# Patient Record
Sex: Female | Born: 1945
Health system: Southern US, Community
[De-identification: ages and names within clinical notes are randomized; demographics above are authoritative.]

## PROBLEM LIST (undated history)

## (undated) DIAGNOSIS — M8580 Other specified disorders of bone density and structure, unspecified site: Secondary | ICD-10-CM

## (undated) DIAGNOSIS — F419 Anxiety disorder, unspecified: Secondary | ICD-10-CM

## (undated) DIAGNOSIS — K219 Gastro-esophageal reflux disease without esophagitis: Secondary | ICD-10-CM

## (undated) DIAGNOSIS — N151 Renal and perinephric abscess: Secondary | ICD-10-CM

## (undated) DIAGNOSIS — F32A Depression, unspecified: Secondary | ICD-10-CM

## (undated) DIAGNOSIS — K635 Polyp of colon: Secondary | ICD-10-CM

## (undated) DIAGNOSIS — F329 Major depressive disorder, single episode, unspecified: Secondary | ICD-10-CM

## (undated) DIAGNOSIS — G4700 Insomnia, unspecified: Secondary | ICD-10-CM

## (undated) DIAGNOSIS — K222 Esophageal obstruction: Secondary | ICD-10-CM

## (undated) DIAGNOSIS — E039 Hypothyroidism, unspecified: Secondary | ICD-10-CM

## (undated) HISTORY — DX: Other specified disorders of bone density and structure, unspecified site: M85.80

## (undated) HISTORY — DX: Major depressive disorder, single episode, unspecified: F32.9

## (undated) HISTORY — DX: Anxiety disorder, unspecified: F41.9

## (undated) HISTORY — DX: Esophageal obstruction: K22.2

## (undated) HISTORY — DX: Insomnia, unspecified: G47.00

## (undated) HISTORY — DX: Polyp of colon: K63.5

## (undated) HISTORY — PX: ESOPHAGEAL DILATION: SHX303

## (undated) HISTORY — PX: TOTAL KNEE ARTHROPLASTY: SHX125

## (undated) HISTORY — DX: Hypothyroidism, unspecified: E03.9

## (undated) HISTORY — DX: Renal and perinephric abscess: N15.1

## (undated) HISTORY — DX: Depression, unspecified: F32.A

---

## 1999-06-16 ENCOUNTER — Other Ambulatory Visit: Admission: RE | Admit: 1999-06-16 | Discharge: 1999-06-16 | Payer: Self-pay | Admitting: Obstetrics and Gynecology

## 1999-12-25 HISTORY — PX: BLADDER SUSPENSION: SHX72

## 1999-12-25 HISTORY — PX: ANTERIOR AND POSTERIOR REPAIR: SHX1172

## 2000-03-20 ENCOUNTER — Encounter: Admission: RE | Admit: 2000-03-20 | Discharge: 2000-03-20 | Payer: Self-pay | Admitting: Urology

## 2000-03-20 ENCOUNTER — Encounter: Payer: Self-pay | Admitting: Urology

## 2000-06-14 ENCOUNTER — Encounter: Payer: Self-pay | Admitting: Urology

## 2000-06-14 ENCOUNTER — Encounter: Admission: RE | Admit: 2000-06-14 | Discharge: 2000-06-14 | Payer: Self-pay | Admitting: Urology

## 2000-07-03 ENCOUNTER — Other Ambulatory Visit: Admission: RE | Admit: 2000-07-03 | Discharge: 2000-07-03 | Payer: Self-pay | Admitting: Gynecology

## 2000-09-17 ENCOUNTER — Inpatient Hospital Stay (HOSPITAL_COMMUNITY): Admission: RE | Admit: 2000-09-17 | Discharge: 2000-09-19 | Payer: Self-pay | Admitting: Urology

## 2001-10-29 ENCOUNTER — Other Ambulatory Visit: Admission: RE | Admit: 2001-10-29 | Discharge: 2001-10-29 | Payer: Self-pay | Admitting: Gynecology

## 2001-12-24 DIAGNOSIS — K635 Polyp of colon: Secondary | ICD-10-CM

## 2001-12-24 HISTORY — DX: Polyp of colon: K63.5

## 2002-01-28 ENCOUNTER — Encounter (INDEPENDENT_AMBULATORY_CARE_PROVIDER_SITE_OTHER): Payer: Self-pay | Admitting: *Deleted

## 2002-01-28 ENCOUNTER — Ambulatory Visit (HOSPITAL_COMMUNITY): Admission: RE | Admit: 2002-01-28 | Discharge: 2002-01-28 | Payer: Self-pay | Admitting: Gastroenterology

## 2003-01-15 ENCOUNTER — Other Ambulatory Visit: Admission: RE | Admit: 2003-01-15 | Discharge: 2003-01-15 | Payer: Self-pay | Admitting: Gynecology

## 2004-04-10 ENCOUNTER — Other Ambulatory Visit: Admission: RE | Admit: 2004-04-10 | Discharge: 2004-04-10 | Payer: Self-pay | Admitting: Gynecology

## 2005-02-08 ENCOUNTER — Ambulatory Visit: Payer: Self-pay | Admitting: Family Medicine

## 2005-04-23 ENCOUNTER — Other Ambulatory Visit: Admission: RE | Admit: 2005-04-23 | Discharge: 2005-04-23 | Payer: Self-pay | Admitting: Gynecology

## 2005-05-22 ENCOUNTER — Ambulatory Visit: Payer: Self-pay | Admitting: Family Medicine

## 2005-09-24 ENCOUNTER — Ambulatory Visit: Payer: Self-pay | Admitting: Family Medicine

## 2006-10-23 ENCOUNTER — Other Ambulatory Visit: Admission: RE | Admit: 2006-10-23 | Discharge: 2006-10-23 | Payer: Self-pay | Admitting: Gynecology

## 2007-01-22 ENCOUNTER — Inpatient Hospital Stay (HOSPITAL_COMMUNITY): Admission: RE | Admit: 2007-01-22 | Discharge: 2007-01-26 | Payer: Self-pay | Admitting: Orthopedic Surgery

## 2007-11-24 ENCOUNTER — Other Ambulatory Visit: Admission: RE | Admit: 2007-11-24 | Discharge: 2007-11-24 | Payer: Self-pay | Admitting: Gynecology

## 2007-11-26 ENCOUNTER — Encounter: Payer: Self-pay | Admitting: Gynecology

## 2007-11-26 ENCOUNTER — Ambulatory Visit (HOSPITAL_BASED_OUTPATIENT_CLINIC_OR_DEPARTMENT_OTHER): Admission: RE | Admit: 2007-11-26 | Discharge: 2007-11-26 | Payer: Self-pay | Admitting: Gynecology

## 2007-11-26 HISTORY — PX: OOPHORECTOMY: SHX86

## 2008-12-29 ENCOUNTER — Other Ambulatory Visit: Admission: RE | Admit: 2008-12-29 | Discharge: 2008-12-29 | Payer: Self-pay | Admitting: Gynecology

## 2008-12-29 ENCOUNTER — Ambulatory Visit: Payer: Self-pay | Admitting: Gynecology

## 2008-12-29 ENCOUNTER — Encounter: Payer: Self-pay | Admitting: Gynecology

## 2009-01-18 ENCOUNTER — Ambulatory Visit: Payer: Self-pay | Admitting: Gynecology

## 2009-01-19 ENCOUNTER — Ambulatory Visit: Payer: Self-pay | Admitting: Gynecology

## 2009-12-30 ENCOUNTER — Ambulatory Visit: Payer: Self-pay | Admitting: Gynecology

## 2009-12-30 ENCOUNTER — Other Ambulatory Visit: Admission: RE | Admit: 2009-12-30 | Discharge: 2009-12-30 | Payer: Self-pay | Admitting: Gynecology

## 2010-03-01 ENCOUNTER — Ambulatory Visit: Payer: Self-pay | Admitting: Gynecology

## 2010-09-08 ENCOUNTER — Ambulatory Visit: Payer: Self-pay | Admitting: Gynecology

## 2011-01-01 ENCOUNTER — Ambulatory Visit
Admission: RE | Admit: 2011-01-01 | Discharge: 2011-01-01 | Payer: Self-pay | Source: Home / Self Care | Attending: Gynecology | Admitting: Gynecology

## 2011-01-01 ENCOUNTER — Other Ambulatory Visit
Admission: RE | Admit: 2011-01-01 | Discharge: 2011-01-01 | Payer: Self-pay | Source: Home / Self Care | Admitting: Gynecology

## 2011-01-11 ENCOUNTER — Ambulatory Visit: Admit: 2011-01-11 | Payer: Self-pay | Admitting: Gynecology

## 2011-01-24 ENCOUNTER — Encounter: Payer: Self-pay | Admitting: Gynecology

## 2011-01-24 ENCOUNTER — Other Ambulatory Visit: Payer: BC Managed Care – PPO

## 2011-01-24 DIAGNOSIS — R823 Hemoglobinuria: Secondary | ICD-10-CM

## 2011-01-24 DIAGNOSIS — Z13220 Encounter for screening for lipoid disorders: Secondary | ICD-10-CM

## 2011-01-24 DIAGNOSIS — Z01419 Encounter for gynecological examination (general) (routine) without abnormal findings: Secondary | ICD-10-CM

## 2011-01-24 DIAGNOSIS — E079 Disorder of thyroid, unspecified: Secondary | ICD-10-CM

## 2011-02-13 ENCOUNTER — Encounter (INDEPENDENT_AMBULATORY_CARE_PROVIDER_SITE_OTHER): Payer: BC Managed Care – PPO

## 2011-02-13 DIAGNOSIS — M949 Disorder of cartilage, unspecified: Secondary | ICD-10-CM

## 2011-05-08 NOTE — Op Note (Signed)
[name], [name]                  ACCOUNT NO.:  [id number]   MEDICAL RECORD NO.:  [id number]          PATIENT TYPE:  AMB   LOCATION:  [facility]                         FACILITY:  [facility]   PHYSICIAN:  Juan H. Lily Peer, M.D.DATE OF BIRTH:  10-30-1946   DATE OF PROCEDURE:  11/26/2007  DATE OF DISCHARGE:                               OPERATIVE REPORT   FIRST ASSISTANT:  Timothy P. Fontaine, M.D.   INDICATIONS FOR OPERATION:  A 65 year old gravida 5, para 3, and AB 2  with history of chronic pelvic pain and history of ovarian cyst.   PREOPERATIVE DIAGNOSES:  1. Chronic left lower quadrant pain.  2. History of ovarian cyst.  3. Family history of ovarian cancer.   POSTOPERATIVE DIAGNOSES:  1. Chronic left lower quadrant pain.  2. History of ovarian cyst.  3. Family history of ovarian cancer.   ANESTHESIA:  General endotracheal anesthesia.   PROCEDURE PERFORMED:  Laparoscopic bilateral salpingo-oophorectomy.   FINDINGS:  Retroverted uterus, normal-appearing fallopian tubes and  ovaries.   DESCRIPTION OF OPERATION:  After the patient was adequately counseled,  she was taken to the operating room where she underwent a successful  general endotracheal anesthesia.  The patient had PSA stockings for DVT  prophylaxis and received 63 g of cefoxitin for prophylaxis as well.  A  Foley catheter was placed to monitor urinary output and the abdomen,  vagina, and perineum had been prepped in the usual sterile fashion.  A  small stab incision was made underneath the umbilicus.  With the use of  the EXcel\OptiVu entrance into the abdominal cavity was performed  under direct visualization.  Due to extensive subcutaneous fat, an open  laparoscopy technique needed to be utilized to gain entrance into the  peritoneal cavity.  A circumferential suture with 0 Vicryl suture around  the fascia to form a seal around the trocar sheath was utilized.  Once  the pneumoperitoneum was established, approximately 3.5  L of carbon  dioxide had been insufflated.  Two additional 5-mm ports were introduced  in the lower abdomen under laparoscopic guidance.  Attention was placed  to the anterior and posterior cul-de-sac.  No adhesions or endometriosis  were noted.  Both tube and ovaries appeared to be normal.  The right  infundibulopelvic ligament was identified.  The ovary was placed under  tension.  The right ureter was identified and with the utilization of  the Harmonic scalpel, the infundibulopelvic ligament was coaptated and  transected as was the remainder of the mesosalpinx up to the area of the  utero-ovarian ligament, which was also transected.  The right tube and  ovary was removed and placed on the anterior cul-de-sac and then a  similar procedure was carried out on the contralateral side.  Through  one of the 5-mm ports, the diagnostic scope was inserted, and the  laparoscopic Endopouch was inserted through the 11-mm port, and both  ovaries were placed into the laparoscopic basket retriever, and was  removed and passed off the operative field for histological evaluation.  The pelvic cavity was then inspected.  There was good  hemostasis.  The  pneumoperitoneum was removed.  The subumbilical incision of the fascia  was closed with running stitches of 0 Vicryl suture, and the  subcutaneous fat was reapproximated with 3-0 Vicryl suture.  The  subcutaneous tissue was closed with 4-0 plain catgut suture.  The two 5-  mm ports were closed with Dermabond and 1 additional stitch on the left  lower port, a single stitch of 4-0 plain catgut suture.  For  postoperative analgesia, 0.25% Marcaine was infiltrated in all 3  incision sites for a total of 10 mL.  Sponge count and needle count were  correct.  The patient was extubated and transferred to recovery room  with stable vital signs.  Blood loss was minimal, and IV fluids  consisted 1700 mL of lactated Ringers, and urine output 300 mL and   clear.      Juan H. Lily Peer, M.D.  Electronically Signed     JHF/MEDQ  D:  11/26/2007  T:  11/27/2007  Job:  [id number]

## 2011-05-08 NOTE — H&P (Signed)
[name], [name]                  ACCOUNT NO.:  [id number]   MEDICAL RECORD NO.:  [id number]          PATIENT TYPE:  AMB   LOCATION:  [facility]                         FACILITY:  [facility]   PHYSICIAN:  Juan H. Lily Peer, M.D.DATE OF BIRTH:  1946-08-31   DATE OF ADMISSION:  DATE OF DISCHARGE:                              HISTORY & PHYSICAL   The patient is scheduled for surgery on Wednesday November 26, 2007, at  8:30 a.m. at [facility].   CHIEF COMPLAINT:  Chronic pelvic pain and history of ovarian cyst.   HISTORY:  The patient is a 65 year old gravida 5, para 3, AB 2 who had  been complaining of chronic pelvic pain, especially on the left lower  quadrant and has had in the past history of ovarian cyst.  Last time she  had been seen in the office was prior to November 24, 2007, visit whereby  an ultrasound in June 05, 2007, had demonstrated that she had an echo-  free thin-walled cyst on the right adnexa, which measured 17 x 12 x 13  mm.  The left ovary was normal.  The patient does have a strong family  history of ovarian cancer whereby 3 aunts and her grandmother have had  history of ovarian and breast cancer as well, and the patient is  cancerphobic and wanted to proceed with a laparoscopy to assess for  pelvic pain and proceed with bilateral salpingo-oophorectomy since she  is now 65 years of age.  She is postmenopausal.  She is on no hormonal  replacement therapy, although she does have hypothyroidism for which she  was on Synthroid but had not returned to the office for a followup TSH  after her TSH had demonstrated in November of last year that it was  subtherapeutic at 0.00, and her Synthroid had been decreased at that  time to 125 mcg daily.  She had a followup TSH done in the office on  November 24, 2007, which was yesterday; result pending at the time of  this dictation.   PAST MEDICAL HISTORY:  The patient denies any allergy.  She had some  form of left renal  abscess many years ago.  Has history of  hypothyroidism; colonic polyps, benign, removed in 2003; suffers from  anxiety and depression for which she takes Paxil.   PAST SURGICAL HISTORY:  Consist of cystocele and rectocele with sling  procedure in 2001 and right total knee replacement.   MEDICATIONS:  Consist of Synthroid 125 mcg daily, Paxil 25 mg daily, and  Caltrate one p.o. b.i.d.   PHYSICAL EXAMINATION:  VITAL SIGNS:  The patient's blood pressure is  121/76.  HEENT:  Unremarkable.  NECK:  Supple.  Trachea midline.  No carotid bruits or thyromegaly.  LUNGS:  Clear to auscultation without rhonchi or wheezes.  HEART:  Regular rate and rhythm.  No murmurs or gallop.  BREASTS:  Unremarkable.  ABDOMEN:  Soft and nontender.  No rebound or guarding.  PELVIC:  Bartholin, urethra, and Skene's within normal limits.  Vagina  and cervix, no lesion or discharge.  Uterus  anteverted; normal size,  shape, and consistency.  Adnexa without any palpable mass or tenderness.  RECTAL:  Hemoccult negative.   ASSESSMENT:  A 65 year old patient with chronic pelvic pain, left  greater than right; prior history of ovarian cyst and a strong family  history of ovarian cancer.  The patient is scheduled to proceed with a  laparoscopic bilateral salpingo-oophorectomy on Wednesday, November 26, 2007, at 8:30 a.m. at [facility].  The risks, benefits,  and pros and cons of the operation were discussed to include infection  although she will receive prophylactic antibiotic; the risk of deep  venous thrombosis and subsequent pulmonary embolism were also discussed  and for prophylaxis, she will be wearing PSA stockings.  We also  discussed the risk of trauma to internal organ requiring emergency  laparotomy and correcting injury.  Also, we discussed in the event of  hemorrhage if she were to need a blood transfusion, risk from her  transfusion include anaphylactic reaction, hepatitis, and AIDS  were  discussed as well.  All these issues were discussed with the patient in  detail.  All questions were answered, and we will follow accordingly.   PLAN:  The patient is scheduled for a laparoscopic bilateral salpingo-  oophorectomy on Wednesday, November 26, 2007, at 8:30 a.m. in [facility].      Juan H. Lily Peer, M.D.  Electronically Signed     JHF/MEDQ  D:  11/25/2007  T:  11/26/2007  Job:  [id number]   cc:   [facility]

## 2011-05-11 NOTE — Procedures (Signed)
. [facility]  Patient:    [name], [name] Visit Number: [id number] MRN: [id number]          Service Type: END Location: ENDO Attending Physician:  Charna Elizabeth Dictated by:   Anselmo Rod, M.D. Proc. Date: 01/28/02 Admit Date:  01/28/2002   CC:         Juan H. Lily Peer, M.D.   Procedure Report  DATE OF BIRTH:  07-28-1946.  PROCEDURE:  Colonoscopy with snare polypectomy x1.  ENDOSCOPIST:  Anselmo Rod, M.D.  INSTRUMENT USED:  Olympus video colonoscope.  INDICATION FOR PROCEDURE:  A 65 year old white female with a history of colon cancer in the family and rectal bleeding.  Rule out colonic polyps, masses, hemorrhoids, etc.  PREPROCEDURE PREPARATION:  Informed consent was procured from the patient. The patient was fasted for eight hours prior to the procedure and prepped with a bottle of magnesium citrate and a gallon of NuLytely the night prior to the procedure.  PREPROCEDURE PHYSICAL:  VITAL SIGNS:  The patient had stable vital signs.  NECK:  Supple.  CHEST:  Clear to auscultation.  S1, S2 regular.  ABDOMEN:  Soft with normal bowel sounds.  DESCRIPTION OF PROCEDURE:  The patient was placed in the left lateral decubitus position and sedated with 50 mg of Demerol and 5 mg of Versed intravenously.  Once the patient was adequately sedate and maintained on low-flow oxygen and continuous cardiac monitoring, the Olympus video colonoscope was advanced from the rectum to the cecum without difficulty.  The patient had a fairly good prep.  Small, nonbleeding internal hemorrhoids were appreciated on retroflexion in the rectum.  The patient had some early left-sided diverticulosis.  A small sessile polyp was snared at 40 cm.  The cecum, right colon, transverse colon appeared normal and without lesions.  IMPRESSION: 1. Small, nonbleeding internal hemorrhoids. 2. Early left-sided diverticulosis. 3. Small sessile polyp snared at  40 cm. 4. Normal-appearing transverse colon, right colon, and cecum.  IMPRESSION: 1. Await pathology results. 2. Avoid all nonsteroidals for the next four weeks. 3. Outpatient follow-up in the next four weeks.   RECOMMENDATIONS: Dictated by:   Anselmo Rod, M.D. Attending Physician:  Charna Elizabeth DD:  01/28/02 TD:  01/29/02 Job: [id number]

## 2011-05-11 NOTE — Discharge Summary (Signed)
NAMELAURENA, [name] NO.:  [id number]   MEDICAL RECORD NO.:  [id number]          PATIENT TYPE:  INP   LOCATION:  1612                         FACILITY:  [facility]   PHYSICIAN:  Ollen Gross, M.D.    DATE OF BIRTH:  1946-07-05   DATE OF ADMISSION:  01/22/2007  DATE OF DISCHARGE:  01/26/2007                               DISCHARGE SUMMARY   ADMISSION DIAGNOSES:  1. Osteoarthritis right knee.  2. Hypothyroidism.  3. Lower temporal seizures.  4. History of previous kidney mass, resolved.  5. Ovarian cyst.   DISCHARGE DIAGNOSES:  1. Osteoarthritis right knee, status post right total knee      arthroplasty.  2. Acute blood loss anemia; did not require transfusion.  3. Hypothyroidism.  4. Lower temporal seizures.  5. History of previous kidney mass, resolved.  6. Ovarian cyst.   PROCEDURE:  January 22, 2007, right total knee.  Surgeon:  Ollen Gross, M.D., Assistant:  Avel Peace, PA-C.  Spinal anesthesia.  Tourniquet time 39 minutes.   CONSULTATIONS:  None.   HISTORY OF PRESENT ILLNESS:  Briefly, [name] is a 65 year old female  with end-stage arthritis of her right knee with progressive worsening  and pain, failed all management including injections, who now presents  for total knee.   LABORATORY DATA:  Preoperative CBC with hemoglobin 13.4, hematocrit  40.1, white blood cell count 8.0. Postoperative hemoglobin 9.8, drifted  down to 8.8.  Last known H and H 8.3 and 24.9.  She was asymptomatic  with low hemoglobin.  PT/PTT 13.2 and 29 respectively on admission, INR  1.0.  Serial Pro Time's followed, last noted PT/INR 27.2 and 2.4.  Chemistry panel on admission all within normal limits.  Serial BMET's  were followed.  Electrolytes remained within normal limits.  Calcium did  drop from 9.5 to 8.3.  TSH level taken, low at 0.044.  Preoperative  urinalysis negative.  Blood group and type O negative.   CLINICAL DATA:  Electrocardiogram January 16, 2007  marked sinus  bradycardia with no significant change when compared to January 16, 2007  confirmed by Dr. Lady Deutscher.  Two-view chest January 16, 2007 no  active disease.   HOSPITAL COURSE:  The patient was admitted to [facility],  tolerated the procedure well.  Later the patient was transferred to the  recovery room and then to the orthopedic floor, started on PCA and p.o.  analgesics for pain control.  Following surgery actually had a pretty  decent night; on the night of surgery was seen on rounds, starting to  get up out of bed.  Hemovac drain was pulled on day #1 to reduce the  fluids, discontinued the nasal cannula.  Patient started getting up with  physical therapy.  By day #2 had gotten a little bit more rest, was  feeling better.  Pain was under better control.  The PCA and IV's were  discontinued.  Felt the patient may be ready to go home over the  weekend.  Her hemoglobin was down to 8.8 but she was asymptomatic.  She  was placed on iron supplements.  Started to get up out of bed more with  physical therapy.  She ambulated about 80 feet.  She was doing well by  day #3.  Dressing was changed on day #2 and then followed each day.  Patient was seen on day #3 and day #4 and continued with therapy, was  tolerating this and by January 26, 2007 tolerating medication's,  progressing with therapy, wound healing well and was discharged home.   DISCHARGE PLAN:  The patient is discharged home on January 26, 2007.   DISCHARGE DIAGNOSES:  Please see above.   DISCHARGE MEDICATIONS:  1. Percocet.  2. Robaxin.  3. Nu-Iron.  4. Coumadin.   DIET:  Resume home diet.   ACTIVITY:  Weight-bearing as tolerated to the right lower extremity.  Home health physical therapy, home health nursing.  May start showering.  Daily dressing changes.   FOLLOWUP:  Followup in two weeks.   DISPOSITION:  Home.   CONDITION ON DISCHARGE:  Improved.      Alexzandrew L. Julien Girt,  P.A.      Ollen Gross, M.D.  Electronically Signed    ALP/MEDQ  D:  02/21/2007  T:  02/21/2007  Job:  [id number]

## 2011-05-11 NOTE — Op Note (Signed)
[facility]  Patient:    [name], [name]                      MRN: [id number] Proc. Date: 09/17/00 Adm. Date:  [id number] Attending:  Londell Moh                           Operative Report  PREOPERATIVE DIAGNOSES: 1. Stress urinary incontinence. 2. Cystocele. 3. Rectocele.  POSTOPERATIVE DIAGNOSES: 1. Stress urinary incontinence. 2. Cystocele. 3. Rectocele.  OPERATION:  Cystoscopy, pubovaginal sling, anterior repair of cystocele, posterior repair of rectocele.  Suprapubic tube placement.  SURGEON:  Jamison Neighbor, M.D.  ANESTHESIA:  General.  COMPLICATIONS:  None.  DRAINS:  None.  INDICATIONS:  [age]year-old female was first seen by me because of problems with a renal abscess.  The patient had appropriate antibiotic therapy and postoperative CT showed resolution of the abscess.  The patient has had significant problems with stress urinary incontinence. She has undergone a urodynamic evaluation which shows a normal bladder with the exception of a very low leak point pressure.  On examination, she had a cystocele and rectocele but no uterine abnormalities, and the uterus was noted to be very small and atrophic.  The patient was seen by Dr. Lily Peer who felt that repair of her cystocele and incontinence repair would certainly be worthwhile but did not feel that hysterectomy was warranted since it was small and in normal position with no prolapse.  The patient agreed not to have the hysterectomy and now is to undergo correction of her incontinence as well as a cystocele repair.  She had requested that a rectocele repair be done at this same time because she is having some problems with her bowels.  The patient understands the risks and benefits of the procedure and gave her full informed consent.  She does realize that she will need a suprapubic tube in place and she may have problems with either complete emptying or urinary  urgency postoperatively that could require additional corrective procedures.  DESCRIPTION OF PROCEDURE:  After the successful induction of general anesthesia, the patient was placed in the dorsal lithotomy position, prepped with Betadine and draped in the usual sterile fashion.  The labia was sutured to the medial thigh and the weighted vaginal speculum was placed.  The anterior vaginal mucosa was then opened with a midline incision beginning at the mid urethral compass extending all the way back towards the cardinal ligaments.  Flaps and mucosa were raised bilaterally and dissection proceeded all the way back towards the space of Retzius.  The space of Retzius was entered on both sides.  A transverse incision was made directly over the pubic bone of approximately 1 inch in length and was carried down to the Scarpas fascia until the rectus sheath and the symphysis pubis were identified.  The patient had a large somewhat redundant and prolapsing cystocele.  Horizontal mattress sutures of 2-0 Vicryl were used to reduce the cystocele.  A sling was then made from a 4 x 7 piece of fascia.  This was constructed in a T-fashion so that the main portion of sling was 7 cm long and 2 cm wide with an extension coming down the middle that was approximately 2 cm in width and 4 cm in length.  The end of this sling were oversewn with #1 nylon.  A ________ was passed from the umbilicus down to the vaginal incision  on both sides and used to pull the sling up.  The sling was then tacked in place with 2-0 Vicryl, the top part bridged between the mid urethral complex and the bladder neck and this tension completely covered the cystocele repair used as reinforcements.  This was tacked down in several places with Vicryl for good coverage.  The anterior vaginal mucosa was then trimmed and closed with a running suture of 2-0 Vicryl.  Cystoscopy was performed.  The bladder was carefully inspected and free of any  tumors or stones.  Both ureteral orifices were of normal configuration and location.  Clear urine was seen to come from each.  The 12 degree and 70 degree lens were used to make sure that there was no injury to the bladder and no suture material could be seen in the bladder in any way.  Elevation of the sling did cause nice suspension of the bladder neck.  Under direct vision, the suprapubic tube was passed through the small stab incision and sutured in place with 2-0 nylon sutures.  The sling was then tied in an appropriate degree of tissue.  The cystoscope was left in place to make sure that there was coaptation but no angulation.  There was 30 degrees or more of play in the cystoscope with no excessive angulation.  With a full bladder, Cred maneuver showed prompt flow of urine and two fingerbreadths could be placed underneath the sling after it had been tied down.  This was done to ensure that there was no over correction.  Attention was then directed to the rectocele.  A transverse incision was made just above the peroneal body and a dissection proceeded underneath the mucosa. The flaps of the mucosa were raised.  The levators were grasped bilaterally and pulled together in the midline with a series of mattress sutures.  The mucosa was trimmed and then closed.  A perineorrhaphy was performed to tighten up the peroneal bind but care was taken to ensure that there was no foreshortening or excessive narrowing of the vagina.  At the end of the procedure, the vagina accepted two fingerbreadths easily with no stenosis or restriction and there was plenty of depth to the vagina with no foreshortening.  The area was then packed.  The labial sutures were then taken down.  The suprapubic tube was placed in straight transit, and dressing was applied.  The patient tolerated the procedure well and was taken to the recovery room in good condition.DD:  09/17/00 TD:  09/17/00 Job: [id number]

## 2011-05-11 NOTE — Op Note (Signed)
NAMETERESEA, [name] NO.:  [id number]   MEDICAL RECORD NO.:  [id number]          PATIENT TYPE:  INP   LOCATION:  0004                         FACILITY:  [facility]   PHYSICIAN:  Ollen Gross, M.D.    DATE OF BIRTH:  23-Dec-1946   DATE OF PROCEDURE:  01/22/2007  DATE OF DISCHARGE:                               OPERATIVE REPORT   PREOPERATIVE DIAGNOSIS:  Osteoarthritis, right knee.   POSTOPERATIVE DIAGNOSIS:  Osteoarthritis, right knee.   PROCEDURE:  Right total knee arthroplasty.   SURGEON:  Ollen Gross, M.D.   ASSISTANT:  Avel Peace, PA-C   ANESTHESIA:  Spinal.   ESTIMATED BLOOD LOSS:  Minimal.   DRAINS:  Hemovac times one.   TOURNIQUET TIME:  39 minutes at 300 mmHg.   COMPLICATIONS:  None.   CONDITION:  Stable to recovery.   BRIEF CLINICAL NOTE:  [name] is a 65 year old female with end-stage  osteoarthritis of the right knee with progressively worsening pain and  dysfunction.  She has failed nonoperative management including  injections and presents for total knee arthroplasty.   PROCEDURE IN DETAIL:  After successful administration of spinal  anesthetic, a tourniquet placed on the right thigh and right lower  extremity prepped and draped in usual sterile fashion.  Extremity was  wrapped in Esmarch, knee flexed, tourniquet inflated 300 mmHg.  Midline  incision made with 10 blade through subcutaneous tissue to level of the  extensor mechanism.  The fresh blade is used to make a medial  parapatellar arthrotomy.  Soft tissue over the proximal medial tibia  subperiosteally elevated to the joint line with the knife and into the  semimembranosus bursa with Cobb elevator.  Soft tissue laterally is  elevated with attention being paid to avoid patellar tendon on tibial  tubercle.  Patella subluxed laterally, knee flexed 90 degrees, ACL and  PCL removed.  Drill was used for a starting hole and distal femur canal  was thoroughly irrigated.  5 degrees  right valgus alignment guide is  placed and referencing off the posterior condyles, rotations marked and  the block pinned to remove 11 mm of the distal femur.  We took 11  because of a flexion contracture.  Sizing blocks placed, size 2.5 is  most appropriate.  Rotations marked at the epicondylar axis.  Size 2.5  cutting blocks placed and the anterior-posterior and chamfer cuts made.   Tibia subluxed forward and the menisci removed.  Extramedullary tibial  alignment guide is placed referencing proximally at the medial aspect of  tibial tubercle and distally along the second metatarsal axis and tibial  crest.  Blocks pinned to remove 10 mm of the non deficient lateral side.  Tibial resection is made with an oscillating saw.  Size 2.5 is the most  appropriate tibial component and the proximal tibia was prepared with  the modular drill and keel punch for size 2.5.  Femoral preparation is  completed with the intercondylar cut.   Size 2.5 mobile bearing tibial trial, 2.5 posterior stabilized femoral  trial and a 10 mm posterior stabilized rotating platform insert  trial  are placed.  With the 10, full extensions achieved with excellent varus  and valgus balance throughout full range of motion.  Patella was  everted, thickness measured to be 20 mm.  Freehand resection taken to 12  mm, 35 templates placed, lug holes were drilled, trial patella is placed  and it tracks normally.  Osteophytes removed off the posterior femur  with the trial in place.  All trials removed and the cut bone surface  was prepared with pulsatile lavage.  Cement was mixed and once ready for  implantation, the size 2.5 mobile bearing tibial tray, size 2.5  posterior stabilized femur and 35 patella are cemented into place.  The  patella was held with the clamp.  Trial 10-mm inserts placed, knee held  in full extension and all extruded cement removed.  Once cement fully  hardened, then the permanent 10 mm posterior  stabilized rotating  platform insert is placed into the tibial tray.  Wound was copiously  irrigated with saline solution and the extensor mechanism closed over  Hemovac drain with interrupted #1 PDS.  Flexion against gravity to 135  degrees.  Tourniquet is released with a  total time of 39 minutes.  Subcu was closed interrupted 2-0 Vicryl, subcuticular running 4-0  Monocryl.  Drains hooked to suction.  Incision cleaned and dried and  Steri-Strips and a bulky sterile dressing applied.  She is then placed  into a knee immobilizer, awakened and transferred to recovery in stable  condition.      Ollen Gross, M.D.  Electronically Signed     FA/MEDQ  D:  01/22/2007  T:  01/22/2007  Job:  [id number]

## 2011-05-11 NOTE — H&P (Signed)
NAMESHAMELL, HITTLE NO.:  [id number]   MEDICAL RECORD NO.:  [id number]          PATIENT TYPE:  INP   LOCATION:  NA                           FACILITY:  [facility]   PHYSICIAN:  Ollen Gross, M.D.    DATE OF BIRTH:  [birth year]   DATE OF ADMISSION:  DATE OF DISCHARGE:                              HISTORY & PHYSICAL   CHIEF COMPLAINT:  Right knee pain.   HISTORY OF PRESENT ILLNESS:  The patient is a 65 year old female who has  been seen by Dr. Lequita Halt for ongoing right knee pain.  She has known  significant end-stage arthritis.  She has undergone injections in the  past but have only provided temporary short-term relief.  She has  reached the point where she would like to have something permanent done  about it.  She has felt she would benefit from undergoing knee  replacement.  Risks and benefits discussed.  The patient was  subsequently admitted to the hospital.   ALLERGIES:  ETHER and also she states one of the NUMBING MEDICATIONS  from the dentist causes her to be sleepy.   CURRENT MEDICATIONS:  Synthroid, Paxil, Mucinex, multivitamins.   PAST MEDICAL HISTORY:  1. Hypothyroidism.  2. Lower temporal seizures.  3. She had a kidney mass in the past which has resolved.  4. Also ovarian cyst.   PAST SURGICAL HISTORY:  1. She has undergone two colonoscopies.  2. Also a tonsillectomy.   FAMILY HISTORY:  Father deceased at age 88 with melanoma.  Father's side  of the family is significant for cancer.  Mother is deceased at age 63  with lung cancer.  She was a smoker.  Mother with history of  hypertension and MI.  Grandmother deceased at age 14 with MI.  Uncle  deceased at age 50 with pneumonia and cancer.   SOCIAL HISTORY:  Married, three children.  Denies history of tobacco  products, alcohol and no illicit drugs.   REVIEW OF SYSTEMS:  GENERAL:  No fevers, chills, night sweats.  NEUROLOGICAL:  No seizures, syncope or paralysis.  RESPIRATORY:  She has  had  a recent upper respiratory infection or cold.  It is much better.  She was treated with Avelox and Mucinex.  CARDIOVASCULAR:  No chest  pain, angina or orthopnea.  GI:  No nausea, vomiting, diarrhea,  constipation.  GU:  No dysuria, hematuria or discharge.  MUSCULOSKELETAL:  Right knee.   PHYSICAL EXAMINATION:  VITAL SIGNS:  Pulse 56, respiration 12, blood  pressure 102/64.  GENERAL:  A 65 year old white female, short in stature, petite.  Well-  developed, well-nourished in no acute distress.  She is alert, oriented,  cooperative and very pleasant.  HEENT:  Normocephalic and atraumatic.  Pupils are equal, round, and  reactive.  Oropharynx clear.  Noted to wear glasses.  EOMs are intact.  NECK:  Supple.  CHEST:  Clear anterior and posterior chest wall.  HEART:  Regular rhythm with a bradycardic rhythm with a pulse of 56.  No  murmurs.  S1-S2 noted.  ABDOMEN:  Soft and nontender.  Bowel sounds present.  RECTAL/BREASTS/GENITALIA:  Not done, not pertinent to present illness.  EXTREMITIES:  Right knee shows range of motion 5 to 120 with marked  crepitus.  No effusions.  Slight varus.   IMPRESSION:  1. Osteoarthritis right knee.  2. Hypothyroidism.  3. Lower temporal seizures.  4. History of a previous kidney mass, resolved.  5. Ovarian cyst.   PLAN:  The patient is admitted to [facility] to undergo a  right total knee arthroplasty.  Surgery will be performed by Dr. Ollen Gross.      Alexzandrew L. Julien Girt, P.A.      Ollen Gross, M.D.  Electronically Signed    ALP/MEDQ  D:  01/21/2007  T:  01/22/2007  Job:  [id number]   cc:   Ollen Gross, M.D.  Fax: [phone]   Patient's chart

## 2011-05-25 ENCOUNTER — Other Ambulatory Visit (INDEPENDENT_AMBULATORY_CARE_PROVIDER_SITE_OTHER): Payer: BC Managed Care – PPO

## 2011-05-25 DIAGNOSIS — E559 Vitamin D deficiency, unspecified: Secondary | ICD-10-CM

## 2011-07-02 ENCOUNTER — Ambulatory Visit (INDEPENDENT_AMBULATORY_CARE_PROVIDER_SITE_OTHER): Payer: BC Managed Care – PPO | Admitting: Gynecology

## 2011-07-02 DIAGNOSIS — N949 Unspecified condition associated with female genital organs and menstrual cycle: Secondary | ICD-10-CM

## 2011-07-02 DIAGNOSIS — N950 Postmenopausal bleeding: Secondary | ICD-10-CM

## 2011-07-02 DIAGNOSIS — N390 Urinary tract infection, site not specified: Secondary | ICD-10-CM

## 2011-07-02 DIAGNOSIS — R350 Frequency of micturition: Secondary | ICD-10-CM

## 2011-07-03 ENCOUNTER — Ambulatory Visit: Payer: BC Managed Care – PPO | Admitting: Gynecology

## 2011-07-09 ENCOUNTER — Ambulatory Visit (INDEPENDENT_AMBULATORY_CARE_PROVIDER_SITE_OTHER): Payer: BC Managed Care – PPO | Admitting: Gynecology

## 2011-07-09 ENCOUNTER — Other Ambulatory Visit: Payer: BC Managed Care – PPO

## 2011-07-09 DIAGNOSIS — N882 Stricture and stenosis of cervix uteri: Secondary | ICD-10-CM

## 2011-07-09 DIAGNOSIS — N950 Postmenopausal bleeding: Secondary | ICD-10-CM

## 2011-07-12 ENCOUNTER — Ambulatory Visit: Payer: BC Managed Care – PPO | Admitting: Gynecology

## 2011-07-12 ENCOUNTER — Other Ambulatory Visit: Payer: BC Managed Care – PPO

## 2011-07-16 ENCOUNTER — Telehealth: Payer: Self-pay | Admitting: *Deleted

## 2011-07-16 NOTE — Telephone Encounter (Signed)
Patient will need to come to office this week for evaluation. She had beeen prescribed Provera 10 mg for 10 days of the month? Has she been taking as directed?

## 2011-07-16 NOTE — Telephone Encounter (Signed)
PT CALLED C/O ON 7/22 BLEEDING BRIGHT RED BLOOD VERY LIGHT(PT STATES ONLY 1 PANTY LINER USE ONLY) CRAMPING BADLY. TODAY PT STATES THAT SHE HAS NO CRAMPING. BUT SHE DOES HAS PAIN WITH URINATION. I ASKED PT IS SHE TAKING PROVERA(AS OF NOTE ON 07/09/11.) PT STATES SHE IS ONLY TAKING ESTRATEST HORMONE PILL ONLY NO RX FOR PROVERA WAS GIVEN. PLEASE ADVISE.

## 2011-07-17 NOTE — Telephone Encounter (Signed)
PT INFORMED WITH BELOW MESSAGE PER JF TO MAKE APPOINTMENT. PT TRANSFER TO APPOINTMENTS.

## 2011-07-18 ENCOUNTER — Ambulatory Visit (INDEPENDENT_AMBULATORY_CARE_PROVIDER_SITE_OTHER): Payer: BC Managed Care – PPO | Admitting: Gynecology

## 2011-07-18 ENCOUNTER — Encounter: Payer: Self-pay | Admitting: Gynecology

## 2011-07-18 DIAGNOSIS — Z1211 Encounter for screening for malignant neoplasm of colon: Secondary | ICD-10-CM

## 2011-07-18 DIAGNOSIS — R82998 Other abnormal findings in urine: Secondary | ICD-10-CM

## 2011-07-18 DIAGNOSIS — R319 Hematuria, unspecified: Secondary | ICD-10-CM

## 2011-07-18 DIAGNOSIS — M549 Dorsalgia, unspecified: Secondary | ICD-10-CM

## 2011-07-18 NOTE — Patient Instructions (Addendum)
We will be contacting you this week for an appointment with the urologist. Your urine had a little bit of blood red blood cells so worsening of her cultures well. The appointment will be with Dr. Patsi Sears at [facility]

## 2011-07-18 NOTE — Progress Notes (Signed)
Patient presented to the office today stating that when she wiped she noticed blood on her tissue paper. She had been seen here in the office a week and half ago it was proceeded with some form of postmenopausal bleeding and underwent an ultrasound and an attempted sonohysterogram. Her endometrial stripe was 1.9 mm A. 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 Her cervix was stenotic and we were unable to do an endometrial biopsy. She is informed today that she is no longer taking hormone replacement therapy should stop her over a year ago which did not inform you. We were going to follow up with an ultrasound in 6 months but presented today with the above mentioned symptoms. Patient's had a history of a left renal abscess several years ago and was taken care of by Dr. Eudelia Bunch urologist. She has some mild CVA tenderness today on her left flank and her urinalysis had 1+ bacteria and a few RBCs. Who was in her urine for culture today. I'm going to proceed with referring her to the urologist here in Dr. Patsi Sears for further evaluation. If evaluation is negative she will return back to the office and we'll schedule for an outpatient procedure consisting of a diagnostic hysteroscopy and endometrial biopsy.

## 2011-07-27 ENCOUNTER — Telehealth: Payer: Self-pay | Admitting: *Deleted

## 2011-07-27 NOTE — Telephone Encounter (Signed)
PT CALLED STATING SHE SAW UROLOGIST. WHAT IS HER NEXT STEP?

## 2011-07-30 NOTE — Telephone Encounter (Signed)
PT CALLED TODAY TO SEE IF JF GOT HER UROLOGY RESULT FROM [facility]. RESULTS WERE IN CHART. PT THEN MAD APPOINTMENT TO SPEAK WITH DR Lily Peer.

## 2011-07-31 ENCOUNTER — Encounter: Payer: Self-pay | Admitting: Gynecology

## 2011-07-31 ENCOUNTER — Ambulatory Visit (INDEPENDENT_AMBULATORY_CARE_PROVIDER_SITE_OTHER): Payer: BC Managed Care – PPO | Admitting: Gynecology

## 2011-07-31 VITALS — BP 140/90

## 2011-07-31 DIAGNOSIS — IMO0001 Reserved for inherently not codable concepts without codable children: Secondary | ICD-10-CM

## 2011-07-31 DIAGNOSIS — R319 Hematuria, unspecified: Secondary | ICD-10-CM

## 2011-07-31 DIAGNOSIS — R350 Frequency of micturition: Secondary | ICD-10-CM

## 2011-07-31 MED ORDER — NITROFURANTOIN MONOHYD MACRO 100 MG PO CAPS: 100.0000 mg | ORAL_CAPSULE | Freq: Two times a day (BID) | ORAL | Status: AC

## 2011-07-31 NOTE — Progress Notes (Signed)
Patient presented to the office today for discussion of her ongoing hematuria. We had done an ultrasound in the office in July 16 which essentially unremarkable with an endometrial stripe of 1.9 mm her Pap smear on January of this year was normal we had done a urinalysis in the office which was negative as were her culture. Patient had finally realized that the blood that she noted was after she urinates when she wipes and there was not coming per vagina or her rectum. She was sent to the urologist for further evaluation a CT had been ordered and the findings were as follows: No acute process or explanation for hematuria. Question bladder wall thickening and pericystic edema as can be seen with cystitis. Possible gallstone. Dr. Heloise Purpura from [facility] evaluated the patient was very unclear to him about her true hematuria versus GYN etiology. Open in a call to speak with him later this week to see if you consider doing a bladder wall biopsy would this thickening was identified on CT scan and to see what her other options he may want to offer the patient. I will give her a trial of Macrobid 1 tablet twice a day for 10 days and continue to monitor her symptoms and will get back with her address paper Dr. Laverle Patter. She is no longer on hormonal replacement therapy and is not sexually active. One final option may be to consider intravaginal estrogen to 3 times a week and continue to monitor symptoms and no change with the Macrodantin. All the above was explained in detail to the patient all questions were answered and we'll follow accordingly.

## 2011-07-31 NOTE — Patient Instructions (Signed)
[name], he had mentioned to me that at times it should noticeable that her blood was after you wiped after urination we did an ultrasound here in the office on July 16 which was completely normal as was her ovaries the thickness of the uterine lining was very very thin as to be expected in a postmenopausal patient. We had done a urinalysis and urine culture which were negative I referred you to the urologist and you had seen Dr. Heloise Purpura after CT scan had been done. The CT scan demonstrated no acute process or explanation for the hematuria  (which means blood in her urine) they did document that there were some bladder thickening and edema which could be seen in cystitis which is an inflammation of the bladder that could be seen with urinary tract infections. And a possible gallstone. I am going to put you on an antibiotic Macrobid 100 mg to take one tablet twice a day for 10 days and I will put into call with Dr. Laverle Patter to see what else he may have to offer.Whenyou were here in the office recently we did a thorough pelvic examination and did not see any abnormalities in the vagina and or cervix. Your gynecological ultrasound was otherwise normal The discomfort you may experience at times abdominally may be related to diverticulosis. Will call you back within a week after I speak with Dr. Laverle Patter and see if we need to do anything differently at this point I do not believe from the gynecological standpoint there is anything I can offer at the present time but we'll continue to work with you and the urologist.

## 2012-01-03 ENCOUNTER — Other Ambulatory Visit (HOSPITAL_COMMUNITY)
Admission: RE | Admit: 2012-01-03 | Discharge: 2012-01-03 | Disposition: A | Discharge status: Home / Self Care | Payer: BC Managed Care – PPO | Source: Ambulatory Visit | Attending: Gynecology | Admitting: Gynecology

## 2012-01-03 ENCOUNTER — Encounter: Payer: Self-pay | Admitting: Gynecology

## 2012-01-03 ENCOUNTER — Ambulatory Visit (INDEPENDENT_AMBULATORY_CARE_PROVIDER_SITE_OTHER): Payer: BC Managed Care – PPO | Admitting: Gynecology

## 2012-01-03 VITALS — BP 130/88 | Ht 59.0 in | Wt 161.0 lb

## 2012-01-03 DIAGNOSIS — F411 Generalized anxiety disorder: Secondary | ICD-10-CM

## 2012-01-03 DIAGNOSIS — E039 Hypothyroidism, unspecified: Secondary | ICD-10-CM

## 2012-01-03 DIAGNOSIS — Z01419 Encounter for gynecological examination (general) (routine) without abnormal findings: Secondary | ICD-10-CM | POA: Insufficient documentation

## 2012-01-03 DIAGNOSIS — F419 Anxiety disorder, unspecified: Secondary | ICD-10-CM | POA: Insufficient documentation

## 2012-01-03 DIAGNOSIS — Z1211 Encounter for screening for malignant neoplasm of colon: Secondary | ICD-10-CM

## 2012-01-03 DIAGNOSIS — F32A Depression, unspecified: Secondary | ICD-10-CM | POA: Insufficient documentation

## 2012-01-03 DIAGNOSIS — Z23 Encounter for immunization: Secondary | ICD-10-CM

## 2012-01-03 DIAGNOSIS — F329 Major depressive disorder, single episode, unspecified: Secondary | ICD-10-CM

## 2012-01-03 DIAGNOSIS — R634 Abnormal weight loss: Secondary | ICD-10-CM

## 2012-01-03 DIAGNOSIS — E559 Vitamin D deficiency, unspecified: Secondary | ICD-10-CM | POA: Insufficient documentation

## 2012-01-03 LAB — URINALYSIS W MICROSCOPIC + REFLEX CULTURE
Glucose, UA: NEGATIVE mg/dL
Leukocytes, UA: NEGATIVE
Nitrite: NEGATIVE
Protein, ur: NEGATIVE mg/dL
Urobilinogen, UA: 0.2 mg/dL (ref 0.0–1.0)

## 2012-01-03 MED ORDER — PAROXETINE HCL ER 25 MG PO TB24: 25.0000 mg | ORAL_TABLET | ORAL | Status: DC

## 2012-01-03 MED ORDER — LEVOTHYROXINE SODIUM 100 MCG PO TABS: 100.0000 ug | ORAL_TABLET | Freq: Every day | ORAL | Status: DC

## 2012-01-03 NOTE — Progress Notes (Signed)
Addended by: Bertram Savin A on: 01/03/2012 02:25 PM   Modules accepted: Orders

## 2012-01-03 NOTE — Patient Instructions (Addendum)
Please remember to schedule your mammogram. Valerian Tea at night will help you sleep.

## 2012-01-03 NOTE — Progress Notes (Signed)
[name] Apr 06, 1946 [id number]   History:    66 y.o.  for annual exam with no major complaints. Patient requesting to have her flu shot and vaccine today. Review of her record indicated she was weighing 186 pounds last year is down to 161 she attributes to eating better and exercising. Review of her record indicated she had a colonoscopy in 2003 benign polyps were removed. She states that she had another colonoscopy around 2007 which was negative. Her last bone density study February 2012 lowest T score AP spine -1.7. Patient with prior history vitamin D deficiency in the past currently on calcium and vitamin D daily. Patient on no hormone replacement therapy. Patient has not had her mammogram despite numerous times that she has been counseled to do so. She does do her monthly self breast examination. She does have history of hypothyroidism.  Past medical history,surgical history, family history and social history were all reviewed and documented in the EPIC chart.  Gynecologic History No LMP recorded. Patient is not currently having periods (Reason: Other). Contraception: none Last Pap: 2012. Results were: normal Last mammogram: No prior study. Results were: No prior study  Obstetric History OB History    Grav Para Term Preterm Abortions TAB SAB Ect Mult Living   6 3 3  3  3   3      # Outc Date GA Lbr Len/2nd Wgt Sex Del Anes PTL Lv   1 TRM 4/73    F SVD  No Yes   2 TRM 11/76    M SVD  No Yes   3 TRM 10/84    F SVD  No Yes   4 SAB            5 SAB            6 SAB                ROS:  Was performed and pertinent positives and negatives are included in the history.  Exam: chaperone present  BP 130/88  Ht 4\' 11"  (1.499 m)  Wt 161 lb (73.029 kg)  BMI 32.52 kg/m2  Body mass index is 32.52 kg/(m^2).  General appearance : Well developed well nourished female. No acute distress HEENT: Neck supple, trachea midline, no carotid bruits, no thyroidmegaly Lungs: Clear to  auscultation, no rhonchi or wheezes, or rib retractions  Heart: Regular rate and rhythm, no murmurs or gallops Breast:Examined in sitting and supine position were symmetrical in appearance, no palpable masses or tenderness,  no skin retraction, no nipple inversion, no nipple discharge, no skin discoloration, no axillary or supraclavicular lymphadenopathy Abdomen: no palpable masses or tenderness, no rebound or guarding Extremities: no edema or skin discoloration or tenderness  Pelvic:  Bartholin, Urethra, Skene Glands: Within normal limits             Vagina: No gross lesions or discharge  Cervix: No gross lesions or discharge  Uterus  axial, normal size, shape and consistency, non-tender and mobile  Adnexa  Without masses or tenderness  Anus and perineum  normal   Rectovaginal  normal sphincter tone without palpated masses or tenderness             Hemoccult obtained results pending at time of this dictation     Assessment/Plan:  66 y.o. female for annual exam with no abnormalities detected. Patient was given a requisition and once again encourage to have her mammogram and its importance discuss. She will continue her calcium and  vitamin D twice a day. Since she had history vitamin D deficiency in the past we'll go and check her vitamin D level today. Because of her weight shift we'll obtain a TSH today along with a random blood sugar and fasting lipid profile as well as a CBC urinalysis and Pap smear. New Pap smear guidelines discussed she will no longer need Pap smear at the age of 32. Fecal occult blood testing done today as well result pending at time of this dictation. She was reminded to check with her gastroenterologist because she is overdue for her colonoscopy. She is on Paxil CR 25 mg daily for her anxiety and depression and it has helped her very much.    Ok Edwards MD, 1:37 PM 01/03/2012

## 2012-01-04 LAB — CBC WITH DIFFERENTIAL/PLATELET
Basophils Absolute: 0.1 10*3/uL (ref 0.0–0.1)
Basophils Relative: 1 % (ref 0–1)
HCT: 41 % (ref 36.0–46.0)
MCHC: 31.7 g/dL (ref 30.0–36.0)
Monocytes Absolute: 0.4 10*3/uL (ref 0.1–1.0)
Neutro Abs: 4.3 10*3/uL (ref 1.7–7.7)
Neutrophils Relative %: 56 % (ref 43–77)
Platelets: 279 10*3/uL (ref 150–400)
RDW: 14.2 % (ref 11.5–15.5)

## 2012-01-04 LAB — LIPID PANEL
LDL Cholesterol: 129 mg/dL — ABNORMAL HIGH (ref 0–99)
Total CHOL/HDL Ratio: 4.1 Ratio
VLDL: 15 mg/dL (ref 0–40)

## 2012-01-04 LAB — GLUCOSE, RANDOM: Glucose, Bld: 88 mg/dL (ref 70–99)

## 2012-01-04 LAB — TSH: TSH: 0.955 u[IU]/mL (ref 0.350–4.500)

## 2012-01-10 ENCOUNTER — Encounter: Payer: Self-pay | Admitting: Gynecology

## 2012-01-10 ENCOUNTER — Ambulatory Visit (INDEPENDENT_AMBULATORY_CARE_PROVIDER_SITE_OTHER): Payer: BC Managed Care – PPO | Admitting: Gynecology

## 2012-01-10 DIAGNOSIS — E559 Vitamin D deficiency, unspecified: Secondary | ICD-10-CM

## 2012-01-10 DIAGNOSIS — R319 Hematuria, unspecified: Secondary | ICD-10-CM

## 2012-01-10 DIAGNOSIS — E785 Hyperlipidemia, unspecified: Secondary | ICD-10-CM

## 2012-01-10 DIAGNOSIS — N950 Postmenopausal bleeding: Secondary | ICD-10-CM

## 2012-01-10 LAB — URINALYSIS W MICROSCOPIC + REFLEX CULTURE
Bilirubin Urine: NEGATIVE
Crystals: NONE SEEN
Glucose, UA: NEGATIVE mg/dL
Leukocytes, UA: NEGATIVE
RBC / HPF: NONE SEEN RBC/hpf (ref <3)
Specific Gravity, Urine: 1.015 (ref 1.005–1.030)
pH: 5.5 (ref 5.0–8.0)

## 2012-01-10 MED ORDER — ERGOCALCIFEROL 1.25 MG (50000 UT) PO CAPS: 50000.0000 [IU] | ORAL_CAPSULE | ORAL | Status: DC

## 2012-01-10 MED ORDER — VITAMIN D3 1.25 MG (50000 UT) PO CAPS: 50000.0000 [IU] | ORAL_CAPSULE | ORAL | Status: DC

## 2012-01-10 NOTE — Patient Instructions (Signed)
Vitamin D 50,000 units/  Take one weekly for twelve weeks then come to office for vitamin D blood level test Take one tablet of Citracal daily After the twelve weeks of treatment I would then like for you to take the Citracal twice a day When you come for blood test come in fasting and we will check your lipid profile again   Ultrasound tomorrow  Remember to schedule your mammogram and call gastroenterologist to find out when your next colonoscopy is due.                           Patient information: High cholesterol (The Basics)  What is cholesterol? - Cholesterol is a substance that is found in the blood. Everyone has some. It is needed for good health. The problem is, people sometimes have too much cholesterol. Compared with people with normal cholesterol, people with high cholesterol have a higher risk of heart attacks, strokes, and other health problems. The higher your cholesterol, the higher your risk of these problems.  Are there different types of cholesterol? - Yes, there are a few different types. If you get a cholesterol test, you may hear your doctor or nurse talk about: Total cholesterol  LDL cholesterol - Some people call this the "bad" cholesterol. That's because having high LDL levels raises your risk of heart attacks, strokes, and other health problems.  HDL cholesterol - Some people call this the "good" cholesterol. That's because having high HDL levels lowers your risk of heart attacks, strokes, and other health problems.  Non-HDL cholesterol - Non-HDL cholesterol is your total cholesterol minus your HDL cholesterol.  Triglycerides - Triglycerides are not cholesterol. They are a type of fat. But they often get measured when cholesterol is measured. (Having high triglycerides also seems to increase the risk of heart attacks and strokes.)  What should my numbers be? - Ask your doctor or nurse what your numbers should be. Different people need different goals. (If you live  outside the [place], see (table 1)). In general, people who do not already have heart disease should aim for: Total cholesterol below 200  LDL cholesterol below 130 - or much lower, if they are at risk of heart attacks or strokes  HDL cholesterol above 60  Non-HDL cholesterol below 160 - or lower, if they are at risk of heart attacks or strokes  Triglycerides below 150 Keep in mind, though, that many people who cannot meet these goals still have a low risk of heart attacks and strokes. What should I do if my doctor tells me I have high cholesterol? - Ask your doctor what your overall risk of heart attacks and strokes is. High cholesterol, by itself, is not always a reason to worry. Having high cholesterol is just one of many things that can increase your risk of heart attacks and strokes. Other factors that increase your risk include:  Cigarette smoking  High blood pressure  Having a parent, sister, or brother who got heart disease at a young age (Young, in this case, means younger than 23 for men and younger than 76 for women.)  Being a man (Women are at risk, too, but men have a higher risk.)  Older age  If you are at high risk of heart attacks and strokes, having high cholesterol is a problem. On the other hand, if you have are at low risk, having high cholesterol may not mean much. Should I take medicine to  lower cholesterol? - Not everyone who has high cholesterol needs medicines. Your doctor or nurse will decide if you need them based on your age, family history, and other health concerns.  You should probably take a cholesterol-lowering medicine called a statin if you: Already had a heart attack or stroke  Have known heart disease  Have diabetes  Have a condition called peripheral artery disease, which makes it painful to walk, and happens when the arteries in your legs get clogged with fatty deposits  Have an abdominal aortic aneurysm, which is a widening of the main artery in the  belly  Most people with any of the conditions listed above should take a statin no matter what their cholesterol level is. If your doctor or nurse puts you on a statin, stay on it. The medicine may not make you feel any different. But it can help prevent heart attacks, strokes, and death.  Can I lower my cholesterol without medicines? - Yes, you can lower your cholesterol some by:  Avoiding red meat, butter, fried foods, cheese, and other foods that have a lot of saturated fat  Losing weight (if you are overweight)  Being more active Even if these steps do little to change your cholesterol, they can improve your health in many ways.   [facility] HMD11:50 AMTD@

## 2012-01-10 NOTE — Progress Notes (Signed)
Patient is a 66 year old who was seen in the office in January 10 for her annual exam presented to the office today to discuss some the abnormal results from lab tests that were done at that visit.  Vitamin D level was found to be low at 20 Patient said history vitamin D deficiency in the past currently taking Caltrate once a day. Patient will be placed on vitamin D 3 cholecalciferol 50,000 units q. weekly for 12 weeks and return to the office for vitamin D level. I've given patient written instructions that with these levels return back to normal I would like for her to take the Caltrate twice a day.  Lipid profile and: Normal with exception LDL elevated at 129 last year was normal at 99. Literature information on cholesterol lowering diet was provided. Patient is not interested in being place on any statins and would like to be retested and we'll do so in 3 months when she comes in for vitamin D level in a fasting state.  CBC, blood sugar, TSH were all normal urine with trace blood  Patient last year had been referred to the urologist because of gross hematuria that patient had reported. When they tested her there was no blood in her urine. Dr. Laverle Patter had done a cystoscopy with no abnormalities noted and had the patient undergo CT of the abdomen and pelvis with and without contrast with reported no acute process. Possible gallstones and questionable bladder wall thickening and. Cystic edema was reported. Patient reports no blood in her urine but at times when she wipes she states she notices some blood. Last year we did an ultrasound and were unable to do an endometrial biopsy because of a severely stenotic cervical os and she had an endometrial thickness of 1.9 mm so the slight bleeding was attributed to atrophic bleed. Patient was to return in 6 months for followup ultrasound for which she will be scheduled for tomorrow to was again to look at her endometrial stripe.  Recent Pap smear was normal.  Patient was reminded once again to schedule her mammogram and to check with her gastroenterologist since she is overdue for her colonoscopy since she has had history of colonic polyps in 2003. Last office visit fecal occult blood testing was negative. We will recheck her urinalysis today.  All the above was provided to the patient in written format for her to adhere to all questions are answered and we'll follow accordingly.

## 2012-01-10 NOTE — Progress Notes (Signed)
Addended byValeda Malm L on: 01/10/2012 03:18 PM   Modules accepted: Orders

## 2012-01-10 NOTE — Progress Notes (Signed)
Addended by: Ok Edwards on: 01/10/2012 12:13 PM   Modules accepted: Orders

## 2012-01-11 ENCOUNTER — Other Ambulatory Visit: Payer: BC Managed Care – PPO

## 2012-01-11 ENCOUNTER — Ambulatory Visit: Payer: BC Managed Care – PPO | Admitting: Gynecology

## 2012-01-15 ENCOUNTER — Ambulatory Visit (INDEPENDENT_AMBULATORY_CARE_PROVIDER_SITE_OTHER): Payer: BC Managed Care – PPO

## 2012-01-15 ENCOUNTER — Ambulatory Visit (INDEPENDENT_AMBULATORY_CARE_PROVIDER_SITE_OTHER): Payer: BC Managed Care – PPO | Admitting: Gynecology

## 2012-01-15 DIAGNOSIS — N950 Postmenopausal bleeding: Secondary | ICD-10-CM

## 2012-01-15 DIAGNOSIS — M8580 Other specified disorders of bone density and structure, unspecified site: Secondary | ICD-10-CM | POA: Insufficient documentation

## 2012-01-15 DIAGNOSIS — R319 Hematuria, unspecified: Secondary | ICD-10-CM

## 2012-01-15 DIAGNOSIS — N952 Postmenopausal atrophic vaginitis: Secondary | ICD-10-CM

## 2012-01-15 DIAGNOSIS — M899 Disorder of bone, unspecified: Secondary | ICD-10-CM

## 2012-01-15 DIAGNOSIS — M949 Disorder of cartilage, unspecified: Secondary | ICD-10-CM

## 2012-01-15 DIAGNOSIS — E559 Vitamin D deficiency, unspecified: Secondary | ICD-10-CM

## 2012-01-15 DIAGNOSIS — E785 Hyperlipidemia, unspecified: Secondary | ICD-10-CM

## 2012-01-15 MED ORDER — NONFORMULARY OR COMPOUNDED ITEM: Status: DC

## 2012-01-15 NOTE — Patient Instructions (Addendum)
[name], light you to continue the vitamin D 50,000 units one tablet weekly for 3 months (12 weeks). I would like you to come to the office after the 3 months to check her vitamin D level. After you have completed a 3 months the vitamin D I would like for you to take vitamin D 3 cholecalciferol 2000 units daily that she can buy over-the-counter. Please read the information below on cholesterol and on hormones. I would like to repeat her fasting lipid profile in 6 months in a few cholesterol level still elevated we may put you on treatment. The vaginal cream that underwent prescribed will be called in to the compounding pharmacy because it will be cheaper for you. I would like for you to apply it intravaginally twice a week to help with her vaginal dryness and irritation. Please remember to schedule your colonoscopy with Dr. Loreta Ave and also your mammogram is due.   Cholesterol is a white, waxy, fat-like protein needed by your body in small amounts. The liver makes all the cholesterol you need. It is carried from the liver by the blood through the blood vessels. Deposits (plaque) may build up on blood vessel walls. This makes the arteries narrower and stiffer. Plaque increases the risk for heart attack and stroke. You cannot feel your cholesterol level even if it is very high. The only way to know is by a blood test to check your lipid (fats) levels. Once you know your cholesterol levels, you should keep a record of the test results. Work with your caregiver to to keep your levels in the desired range. WHAT THE RESULTS MEAN:  Total cholesterol is a rough measure of all the cholesterol in your blood.   LDL is the so-called bad cholesterol. This is the type that deposits cholesterol in the walls of the arteries. You want this level to be low.   HDL is the good cholesterol because it cleans the arteries and carries the LDL away. You want this level to be high.   Triglycerides are fat that the body can either burn  for energy or store. High levels are closely linked to heart disease.  DESIRED LEVELS:  Total cholesterol below 200.   LDL below 100 for people at risk, below 70 for very high risk.   HDL above 50 is good, above 60 is best.   Triglycerides below 150.  HOW TO LOWER YOUR CHOLESTEROL:  Diet.   Choose fish or white meat chicken and [place], roasted or baked. Limit fatty cuts of red meat, fried foods, and processed meats, such as sausage and lunch meat.   Eat lots of fresh fruits and vegetables. Choose whole grains, beans, pasta, potatoes and cereals.   Use only small amounts of olive, corn or canola oils. Avoid butter, mayonnaise, shortening or palm kernel oils. Avoid foods with trans-fats.   Use skim/nonfat milk and low-fat/nonfat yogurt and cheeses. Avoid whole milk, cream, ice cream, egg yolks and cheeses. Healthy desserts include angel food cake, ginger snaps, animal crackers, hard candy, popsicles, and low-fat/nonfat frozen yogurt. Avoid pastries, cakes, pies and cookies.   Exercise.   A regular program helps decrease LDL and raises HDL.   Helps with weight control.   Do things that increase your activity level like gardening, walking, or taking the stairs.   Medication.   May be prescribed by your caregiver to help lowering cholesterol and the risk for heart disease.   You may need medicine even if your levels are normal if  you have several risk factors.  HOME CARE INSTRUCTIONS   Follow your diet and exercise programs as suggested by your caregiver.   Take medications as directed.   Have blood work done when your caregiver feels it is necessary.  MAKE SURE YOU:   Understand these instructions.   Will watch your condition.   Will get help right away if you are not doing well or get worse.  Document Released: 09/04/2001 Document Revised: 08/22/2011 Document Reviewed: 02/25/2008 [facility] Patient Information 2012 [place], [place].                          Patient  information: High cholesterol (The Basics)  What is cholesterol? - Cholesterol is a substance that is found in the blood. Everyone has some. It is needed for good health. The problem is, people sometimes have too much cholesterol. Compared with people with normal cholesterol, people with high cholesterol have a higher risk of heart attacks, strokes, and other health problems. The higher your cholesterol, the higher your risk of these problems.  Are there different types of cholesterol? - Yes, there are a few different types. If you get a cholesterol test, you may hear your doctor or nurse talk about: Total cholesterol  LDL cholesterol - Some people call this the "bad" cholesterol. That's because having high LDL levels raises your risk of heart attacks, strokes, and other health problems.  HDL cholesterol - Some people call this the "good" cholesterol. That's because having high HDL levels lowers your risk of heart attacks, strokes, and other health problems.  Non-HDL cholesterol - Non-HDL cholesterol is your total cholesterol minus your HDL cholesterol.  Triglycerides - Triglycerides are not cholesterol. They are a type of fat. But they often get measured when cholesterol is measured. (Having high triglycerides also seems to increase the risk of heart attacks and strokes.)  What should my numbers be? - Ask your doctor or nurse what your numbers should be. Different people need different goals. (If you live outside the [place], see (table 1)). In general, people who do not already have heart disease should aim for: Total cholesterol below 200  LDL cholesterol below 130 - or much lower, if they are at risk of heart attacks or strokes  HDL cholesterol above 60  Non-HDL cholesterol below 160 - or lower, if they are at risk of heart attacks or strokes  Triglycerides below 150 Keep in mind, though, that many people who cannot meet these goals still have a low risk of heart attacks and strokes. What  should I do if my doctor tells me I have high cholesterol? - Ask your doctor what your overall risk of heart attacks and strokes is. High cholesterol, by itself, is not always a reason to worry. Having high cholesterol is just one of many things that can increase your risk of heart attacks and strokes. Other factors that increase your risk include:  Cigarette smoking  High blood pressure  Having a parent, sister, or brother who got heart disease at a young age (Young, in this case, means younger than 37 for men and younger than 54 for women.)  Being a man (Women are at risk, too, but men have a higher risk.)  Older age  If you are at high risk of heart attacks and strokes, having high cholesterol is a problem. On the other hand, if you have are at low risk, having high cholesterol may not mean much. Should I  take medicine to lower cholesterol? - Not everyone who has high cholesterol needs medicines. Your doctor or nurse will decide if you need them based on your age, family history, and other health concerns.  You should probably take a cholesterol-lowering medicine called a statin if you: Already had a heart attack or stroke  Have known heart disease  Have diabetes  Have a condition called peripheral artery disease, which makes it painful to walk, and happens when the arteries in your legs get clogged with fatty deposits  Have an abdominal aortic aneurysm, which is a widening of the main artery in the belly  Most people with any of the conditions listed above should take a statin no matter what their cholesterol level is. If your doctor or nurse puts you on a statin, stay on it. The medicine may not make you feel any different. But it can help prevent heart attacks, strokes, and death.  Can I lower my cholesterol without medicines? - Yes, you can lower your cholesterol some by:  Avoiding red meat, butter, fried foods, cheese, and other foods that have a lot of saturated fat  Losing weight (if you  are overweight)  Being more active Even if these steps do little to change your cholesterol, they can improve your health in many ways.  hHormonal Therapy for Women, Frequently Asked Questions WHAT IS HORMONE THERAPY? Hormone therapy (HT), estrogen and progesterone, provides women with the female hormones that decrease and are lost as women get older. When the hormone estrogen is given alone, it is usually referred to as "ERT." When the hormone progesterone is combined with estrogen, it is generally called "HT." Previously this was known as hormone replacement therapy (HRT). Estrogen is a female hormone that brings about changes in various organs in the body. Progesterone is a female hormone that prepares the uterus for a pregnancy each month. During the change-over to menopause ("perimenopause") these hormone levels start to decrease. This causes many uncomfortable symptoms (see below). When the ovaries stop producing estrogen and progesterone, menstrual periods come to an end. At this point, the woman has experienced menopause. Menopause is complete when a woman misses 12 consecutive menstrual periods. WHAT ARE THE BENEFITS OF HORMONE THERAPY? Hormone therapy has been used to relieve the short-term symptoms of menopause. These include:  Hot flashes.   Depression.   Memory loss.   Correcting irregular menstrual periods.   Night sweats.   Tiredness.   Mood disturbances.   Thinning of scalp hair.   Disturbed sleep.   Vaginal dryness.   Painful intercourse.   Loss of breast tissue.  Evidence shows that HT may be helpful in preventing colon cancer and bone loss (osteoporosis). WHAT ARE THE SHORT-TERM RISKS OF HORMONE THERAPY?  Some women report side effects from taking Hormone Therapy, including:   Feeling sick to stomach (nausea).   Fluid retention.   Swollen breasts.   Acne, when taking HT with progesterone.   Unusual vaginal discharge and bleeding (if the uterus is  present).   Headaches.   Some women think HT will make them gain weight. Research now shows this is not true. Some women do gain weight during menopause, but this is because their metabolism slows down as they age. They also may not be increasing their amount or level of physical activity as they get older.   Short-term benefits or side effects should become noticeable within days, weeks, or sometimes months after treatment begins.  LONG-TERM RISKS These will not be easily  noticeable for each individual woman. There are many factors involved that can contribute to long-term risks and side effects. CANCER There is concern that HT can increase the risk of some cancers, including endometrial cancer (lining of the uterus), breast, and certain (but not all) ovarian or cervix cancers, such as endometriod ovarian cancer.  When estrogen is taken alone, it raises the risk of endometrial cancer, if the uterus is still present. Adding progestin with estrogen (HT) can greatly reduce this risk. Progestin is added to prevent the overgrowth (hyperplasia) of cells in the uterine lining. Women who still have an intact uterus are generally given this combined therapy and should not take estrogen hormone alone without progesterone. HT with estrogen and progestin has been linked to an increased risk of invasive breast cancer. Women who use estrogen plus progestin for four years or longer are more likely to develop breast cancer than women who have not used them for as long. This indicates that the therapy may have a cumulative effect. The decision to take HT should be based on an overall look at the risk and benefits, and how they fit with your personal and genetic health profile. Conditions that increase the underlying risk of developing breast cancer include:  Family history of breast cancer.   Early age of the first menstrual period (menarche).   Late age of child bearing.   High fat diet.   Late menopause.     Obesity.   Increased breast density on mammograms.   Certain non-cancerous (benign) breast lesions.   Excessive use of alcohol.   Extensive radiation exposure to the chest.  These factors need to be considered when deciding to take HT. If you are currently taking HT and have concerns, talk with your caregiver as soon as possible.  BREAST DENSITY Taking both estrogen and progestin also can affect a woman's breast density. Increased breast density from HT makes it hard for a radiologist to read some special breast x-rays (mammograms). This leads to the need for follow-up mammograms, ultrasound or MRI (magnetic resonance imaging), or taking breast tissue samples that are surgically removed (biopsies). Increased density also is a concern because studies have shown that women age 9 and older, whose mammograms show at least 75 percent dense tissue, are at increased risk for breast cancer. However, it is not known if increased breast density due to HT carries the same risk for breast cancer as having naturally dense breasts. About 25 percent of women who use combined HT have an increase in breast density on their mammograms. This is compared to about 8 percent of women taking estrogen alone. One study showed that stopping HT for about 2 weeks before having a mammogram improved the readability of the mammogram. But further research is needed to confirm the usefulness of this approach. HEART DISEASE In the past, taking HT (estrogen plus progestin) was thought to help protect women against heart disease. However, recent findings show that taking HT poses more risks than benefits. HT could increase a woman's risk for:  Heart disease.   Stroke.   Blood clot in the lung (pulmonary embolism).   Breast cancer.   Blood clots in the legs.  Women who have gone through menopause should not be given HT to prevent heart disease and other chronic conditions.  Women who have gone through menopause and who  have heart disease, may have a greater risk of another cardiac event (like heart attack) after starting HT, at least in the short-term. For women  who have had strokes, their risk for having another stroke goes up when they start taking HT. Hormones are not recommended for women with heart disease or for women who have had a stroke. If you have gone through menopause, talk with your caregiver about whether hormones are right for you. You can check the [facility] Information Center website (http://hoffman.com/) for updates on postmenopausal hormone therapy. OTHER RISKS INCLUDE:  Developing high blood pressure.   Developing gallbladder disease.   Women with a fibroid non-cancerous tumor on the uterus may develop pain, bleeding or increase growth of the fibroid.  If you are taking HT, watch for signs of trouble. These include:  Abnormal bleeding.   Breast lumps, bloody discharge or red/painful breasts.   Shortness of breath.   Dizziness.   Abdominal pain.   Severe headaches.   Pain in your calves or chest.  Report these signs to your caregiver right away. Also, talk with your caregiver about how often you should have an exam. DOES THE DURATION OF TAKING HT AFFECT BREAST CANCER RISK? The relationship between a woman's risk of developing breast cancer and the length of time that she receives HT is not clear. Some women take HT for only a few years until the worst of their menopausal symptoms have passed. Others have taken it for 10 years or more. Some researchers believe that there is little or no increased risk of breast cancer associated with short-term use of either HT with estrogen alone or estrogen combined with progestin. But long-term use is linked to an increased risk. Women on HT should continue to do monthly self breast exams and get their mammograms as recommended by their caregiver. WHY IS MENOPAUSAL HORMONE THERAPY USED IN SPITE OF THE CANCER RISK? The known benefits  of HT can improve the quality of life for many women, by reducing uncomfortable symptoms, as mentioned above. There also is evidence that HT helps prevent and treats osteoporosis. There is preliminary evidence that it can help prevent other problems associated with age, including colon cancer. The addition of progestin to the treatment has greatly reduced the risk of uterine cancer. ARE THERE OTHER DRUG THERAPIES KNOWN TO TREAT CONDITIONS RELATED TO MENOPAUSE? A class of antidepressant drugs called Selective Serotonin Reuptake Inhibitors (SSRIs) are effective in treating menopause-related symptoms of depression or mood changes. Vitamin E and Clonidine (drug typically used for high blood pressure) can help reduce hot flashes. To prevent osteoporosis, women who are at high risk for bone loss may be given drugs such as bisphosphonates, alendronate, raloxifene, calcium with vitamin D, calcitonin, and prescription medicines such as fasomax or boneva. Lastly, a class of cholesterol-lowering drugs called HMG-CoA-reductase inhibitors (statins) are proven to be effective for reducing risk of heart disease. They are also being explored to prevent osteoporosis. No alternatives to estrogen exist for prevention of colon cancer - a disease for which early evidence suggests HT may be beneficial. WHO SHOULD NOT USE HT?  HT is often not recommended for women who have any of the following conditions:  Vaginal bleeding of an unknown cause.   Suspected breast cancer or history of breast cancer.   History of endometrial or uterine cancer.   Chronic disease of the liver.   History of heart disease.   History of blood clots in the veins or legs or in the lung (venous thrombosis). This includes women who have had thrombosis or blood clots during pregnancy or when taking birth control pills. Although the risk of blood  clots in women is very low, HT increases the risk.   Severe or uncontrolled high blood pressure.    Anyone who may be pregnant.  HOW CAN I SORT THROUGH THE BENEFITS AND RISKS TO MAKE A GOOD DECISION ABOUT WHETHER OR NOT TO USE POSTMENOPAUSAL HORMONE THERAPY? Here are several helpful points, summarizing the findings of the [facility]) study:  First, it is important to know that because the study involved healthy women, only a small number of them had either a negative or positive effect from estrogen plus progestin therapy. The percentages describe what would happen to a whole population, not necessarily to any individual woman. Second, remember that percentages are not fate. Whether expressing risks or benefits, a percentage does not mean you will develop a disease. Many factors affect that likelihood, including:  Your lifestyle.   Environmental factors.   Heredity.   Your personal medical history.  Realize that most treatments carry risks and benefits. No one can make a treatment choice for you. Talk with your caregiver and decide what is best for your health and quality of life. Begin by finding out your family history and your personal risk profile for:  Heart disease.   Stroke.   Breast cancer.   Osteoporosis.   Colorectal cancer.   Blood clots.   Other medical conditions.  Document Released: 09/08/2003 Document Revised: 08/22/2011 Document Reviewed: 10/10/2009 [facility] Patient Information 2012 [place], [place].   [facility] [id number] PMTD@

## 2012-01-15 NOTE — Progress Notes (Signed)
Patient is a 66 year old who presented to the office today to discuss several issues as well as an ultrasound. Patient has had issues with noticing some blood when she wipes and has been unable to be determined if it was from her bladder although she states after urinating when she wipes is when she has noted it. She does have history of colon polyps in the past in 2003. Her colonoscopy in 2007 was reported normal and she is overdue for followup colonoscopy. Her recent fecal occult blood testing the office was normal. Her vitamin D level was found to be low at 20 and she was started on vitamin D 50,000 units q. weekly for 12 weeks. She is to return to the office in 3 months for follow up vitamin D level and have instructed her to take vitamin D 3 cholecalciferol 2000 units daily after she finishes the above treatment if her values her return back to normal. Her recent blood sugar CBC TSH and Pap smear were all normal. Her lipid profile demonstrated her LDL was elevated at 129 (normal less than 99). We had provided her with cholesterol lowering diet handout. She would rather not start on any statins at the present time and wait 6 months to be tested again.  She had been referred to the urologist for suspected microscopic hematuria Dr. Laverle Patter had done cystoscopy with no abnormalities noted. She had also undergone an abdominal pelvic CT with and without contrast with no acute process noted questionable gallstones. There was also questionable bladder wall thickening and pericystic edema such as seen with cystitis. She had been placed on Macrobid 100 mg twice a day for 7 days and is reported no bleeding of any sort recently. She does have vaginal atrophy which may be a source for at times the like pinkish discharge she is noted. Nevertheless we had her come to the office today for an ultrasound and to determine endometrial stripe. The ultrasound demonstrated uterus to measure 5.5 x 4.6 x 2.6 cm with an endometrial  stripe of 1.2 mm. Right and left ovary otherwise negative.  She will be placed on estradiol 0.02% vaginal cream. 1 cc prefilled applicator to apply twice a week for vaginal atrophy. Literature information on hormone replacement therapy was provided as well as the risk and benefits and pros and cons discussed. When she returns back to the office in 6 months for fasting lipid profile we will touch base to see if she has noted any further vaginal spotting. Of note her recent urinalysis did not demonstrate any blood.

## 2012-01-16 ENCOUNTER — Telehealth: Payer: Self-pay | Admitting: *Deleted

## 2012-01-16 NOTE — Telephone Encounter (Signed)
Wrong chart

## 2013-01-06 ENCOUNTER — Encounter: Payer: Self-pay | Admitting: Gynecology

## 2013-01-06 ENCOUNTER — Other Ambulatory Visit: Payer: Self-pay | Admitting: Gynecology

## 2013-01-06 ENCOUNTER — Ambulatory Visit (INDEPENDENT_AMBULATORY_CARE_PROVIDER_SITE_OTHER): Payer: BC Managed Care – PPO | Admitting: Gynecology

## 2013-01-06 VITALS — BP 124/82 | Ht 58.5 in | Wt 173.0 lb

## 2013-01-06 DIAGNOSIS — Z8639 Personal history of other endocrine, nutritional and metabolic disease: Secondary | ICD-10-CM

## 2013-01-06 DIAGNOSIS — M8580 Other specified disorders of bone density and structure, unspecified site: Secondary | ICD-10-CM

## 2013-01-06 DIAGNOSIS — M899 Disorder of bone, unspecified: Secondary | ICD-10-CM

## 2013-01-06 DIAGNOSIS — F3289 Other specified depressive episodes: Secondary | ICD-10-CM

## 2013-01-06 DIAGNOSIS — F329 Major depressive disorder, single episode, unspecified: Secondary | ICD-10-CM

## 2013-01-06 DIAGNOSIS — E039 Hypothyroidism, unspecified: Secondary | ICD-10-CM

## 2013-01-06 DIAGNOSIS — Z01419 Encounter for gynecological examination (general) (routine) without abnormal findings: Secondary | ICD-10-CM

## 2013-01-06 DIAGNOSIS — F419 Anxiety disorder, unspecified: Secondary | ICD-10-CM

## 2013-01-06 DIAGNOSIS — N952 Postmenopausal atrophic vaginitis: Secondary | ICD-10-CM

## 2013-01-06 DIAGNOSIS — M949 Disorder of cartilage, unspecified: Secondary | ICD-10-CM

## 2013-01-06 DIAGNOSIS — G4700 Insomnia, unspecified: Secondary | ICD-10-CM

## 2013-01-06 DIAGNOSIS — N951 Menopausal and female climacteric states: Secondary | ICD-10-CM

## 2013-01-06 DIAGNOSIS — F411 Generalized anxiety disorder: Secondary | ICD-10-CM

## 2013-01-06 LAB — CBC WITH DIFFERENTIAL/PLATELET
Basophils Relative: 1 % (ref 0–1)
Eosinophils Absolute: 0.2 10*3/uL (ref 0.0–0.7)
HCT: 40 % (ref 36.0–46.0)
Hemoglobin: 13.2 g/dL (ref 12.0–15.0)
Lymphs Abs: 2.8 10*3/uL (ref 0.7–4.0)
MCH: 27.3 pg (ref 26.0–34.0)
MCHC: 33 g/dL (ref 30.0–36.0)
Monocytes Absolute: 0.5 10*3/uL (ref 0.1–1.0)
Monocytes Relative: 6 % (ref 3–12)
RBC: 4.83 MIL/uL (ref 3.87–5.11)

## 2013-01-06 LAB — COMPREHENSIVE METABOLIC PANEL
ALT: 11 U/L (ref 0–35)
CO2: 24 mEq/L (ref 19–32)
Calcium: 9.4 mg/dL (ref 8.4–10.5)
Chloride: 107 mEq/L (ref 96–112)
Creat: 0.64 mg/dL (ref 0.50–1.10)
Glucose, Bld: 89 mg/dL (ref 70–99)
Total Bilirubin: 0.6 mg/dL (ref 0.3–1.2)

## 2013-01-06 LAB — LIPID PANEL
Cholesterol: 166 mg/dL (ref 0–200)
Total CHOL/HDL Ratio: 3.9 Ratio
Triglycerides: 88 mg/dL (ref <150)
VLDL: 18 mg/dL (ref 0–40)

## 2013-01-06 MED ORDER — PAROXETINE HCL ER 25 MG PO TB24: 25.0000 mg | ORAL_TABLET | ORAL | Status: DC

## 2013-01-06 MED ORDER — ZALEPLON 5 MG PO CAPS: 5.0000 mg | ORAL_CAPSULE | Freq: Every day | ORAL | Status: DC

## 2013-01-06 MED ORDER — NONFORMULARY OR COMPOUNDED ITEM: Status: DC

## 2013-01-06 MED ORDER — LEVOTHYROXINE SODIUM 100 MCG PO TABS: 100.0000 ug | ORAL_TABLET | Freq: Every day | ORAL | Status: DC

## 2013-01-06 NOTE — Telephone Encounter (Signed)
Needs annual exam

## 2013-01-06 NOTE — Telephone Encounter (Signed)
Please call in prescription for Levothyroid 100 MCG to take one PO QD #30 1 refills. She is overdue for RGCE.

## 2013-01-06 NOTE — Progress Notes (Signed)
Amye Grego [birth year] [id number]   History:    67 y.o.  for annual gyn exam who has done well since she was started on vaginal estrogen twice a week for vaginal atrophy. She does suffer from insomnia and depression as well as anxiety. Review of her record indicated that she said history of colon polyps in the past her last colonoscopy was in 2007. She also has past history of osteopenia as well as vitamin D deficiency. Review of her record indicated that last year she was treated for vitamin D deficiency with 50,000 units of vitamin D for 12 weeks but did not return for followup. She is taking Caltrate 1 tablet twice a day and vitamin D of thousand units daily. Patient does have past history of cystocele and rectocele repair as well as a sling procedure for which she has done well. Patient denies any prior history of abnormal Pap smears. Patient with past history of laparoscopic BSO.  Past medical history,surgical history, family history and social history were all reviewed and documented in the EPIC chart.  Gynecologic History No LMP recorded. Patient is not currently having periods (Reason: Other). Contraception: post menopausal status Last Pap: 2013. Results were: normal Last mammogram: 2013. Results were: normal  Obstetric History OB History    Grav Para Term Preterm Abortions TAB SAB Ect Mult Living   6 3 3  3  3   3      # Outc Date GA Lbr Len/2nd Wgt Sex Del Anes PTL Lv   1 TRM 4/73    F SVD  No Yes   2 TRM 11/76    M SVD  No Yes   3 TRM 10/84    F SVD  No Yes   4 SAB            5 SAB            6 SAB                ROS: A ROS was performed and pertinent positives and negatives are included in the history.  GENERAL: No fevers or chills. HEENT: No change in vision, no earache, sore throat or sinus congestion. NECK: No pain or stiffness. CARDIOVASCULAR: No chest pain or pressure. No palpitations. PULMONARY: No shortness of breath, cough or wheeze. GASTROINTESTINAL: No abdominal  pain, nausea, vomiting or diarrhea, melena or bright red blood per rectum. GENITOURINARY: No urinary frequency, urgency, hesitancy or dysuria. MUSCULOSKELETAL: No joint or muscle pain, no back pain, no recent trauma. DERMATOLOGIC: No rash, no itching, no lesions. ENDOCRINE: No polyuria, polydipsia, no heat or cold intolerance. No recent change in weight. HEMATOLOGICAL: No anemia or easy bruising or bleeding. NEUROLOGIC: No headache, seizures, numbness, tingling or weakness. PSYCHIATRIC: No depression, no loss of interest in normal activity or change in sleep pattern.     Exam: chaperone present  BP 124/82  Ht 4' 10.5" (1.486 m)  Wt 173 lb (78.472 kg)  BMI 35.54 kg/m2  Body mass index is 35.54 kg/(m^2).  General appearance : Well developed well nourished female. No acute distress HEENT: Neck supple, trachea midline, no carotid bruits, no thyroidmegaly Lungs: Clear to auscultation, no rhonchi or wheezes, or rib retractions  Heart: Regular rate and rhythm, no murmurs or gallops Breast:Examined in sitting and supine position were symmetrical in appearance, no palpable masses or tenderness,  no skin retraction, no nipple inversion, no nipple discharge, no skin discoloration, no axillary or supraclavicular lymphadenopathy Abdomen: no palpable masses or tenderness,  no rebound or guarding Extremities: no edema or skin discoloration or tenderness  Pelvic:  Bartholin, Urethra, Skene Glands: Within normal limits             Vagina: No gross lesions or discharge, atrophic changes  Cervix: No gross lesions or discharge  Uterus  axial, normal size, shape and consistency, non-tender and mobile  Adnexa  Without masses or tenderness  Anus and perineum  normal   Rectovaginal  normal sphincter tone without palpated masses or tenderness             Hemoccult cards provided     Assessment/Plan:  67 y.o. female for annual exam who has done well on estradiol 0.02% twice a week application of her vaginal  atrophy for which prescription refill was provided. She will be prescribed Sonata for her insomnia to take 5 mg each bedtime when necessary. She was given a prescription for Paxil CR 25 mg which she takes daily which has helped her tremendously as well. She will schedule a bone density study for the end of February and she's also to be scheduling her mammogram. I've also given her a prescription for her to obtain her shingles vaccine. She was from it once again to continue to do her monthly self breast examination. We discussed importance of calcium and vitamin D and regular exercise for osteoporosis prevention. The following labs were ordered today: Vitamin D level, TSH, CBC, fasting lipid profile, comprehensive metabolic panel and urinalysis. Hemoccult cards were provided her to submit to the office for testing. The new Pap smear screening guidelines discussed. No Pap smear done today. Prescription refill for levothyroxin 100 mcg was provided as well.    Ok Edwards MD, 12:28 PM 01/06/2013

## 2013-01-06 NOTE — Patient Instructions (Addendum)
Shingles Vaccine What You Need to Know WHAT IS SHINGLES?  Shingles is a painful skin rash, often with blisters. It is also called Herpes Zoster or just Zoster.  A shingles rash usually appears on one side of the face or body and lasts from 2 to 4 weeks. Its main symptom is pain, which can be quite severe. Other symptoms of shingles can include fever, headache, chills, and upset stomach. Very rarely, a shingles infection can lead to pneumonia, hearing problems, blindness, brain inflammation (encephalitis), or death.  For about 1 person in 5, severe pain can continue even after the rash clears up. This is called post-herpetic neuralgia.  Shingles is caused by the Varicella Zoster virus. This is the same virus that causes chickenpox. Only someone who has had a case of chickenpox or rarely, has gotten chickenpox vaccine, can get shingles. The virus stays in your body. It can reappear many years later to cause a case of shingles.  You cannot catch shingles from another person with shingles. However, a person who has never had chickenpox (or chickenpox vaccine) could get chickenpox from someone with shingles. This is not very common.  Shingles is far more common in people 50 and older than in younger people. It is also more common in people whose immune systems are weakened because of a disease such as cancer or drugs such as steroids or chemotherapy.  At least 1 million people get shingles per year in the [place]. SHINGLES VACCINE  A vaccine for shingles was licensed in 2006. In clinical trials, the vaccine reduced the risk of shingles by 50%. It can also reduce the pain in people who still get shingles after being vaccinated.  A single dose of shingles vaccine is recommended for adults 60 years of age and older. SOME PEOPLE SHOULD NOT GET SHINGLES VACCINE OR SHOULD WAIT A person should not get shingles vaccine if he or she:  Has ever had a life-threatening allergic reaction to gelatin, the  antibiotic neomycin, or any other component of shingles vaccine. Tell your caregiver if you have any severe allergies.  Has a weakened immune system because of current:  AIDS or another disease that affects the immune system.  Treatment with drugs that affect the immune system, such as prolonged use of high-dose steroids.  Cancer treatment, such as radiation or chemotherapy.  Cancer affecting the bone marrow or lymphatic system, such as leukemia or lymphoma.  Is pregnant, or might be pregnant. Women should not become pregnant until at least 4 weeks after getting shingles vaccine. Someone with a minor illness, such as a cold, may be vaccinated. Anyone with a moderate or severe acute illness should usually wait until he or she recovers before getting the vaccine. This includes anyone with a temperature of 101.3 F (38 C) or higher. WHAT ARE THE RISKS FROM SHINGLES VACCINE?  A vaccine, like any medicine, could possibly cause serious problems, such as severe allergic reactions. However, the risk of a vaccine causing serious harm, or death, is extremely small.  No serious problems have been identified with shingles vaccine. Mild Problems  Redness, soreness, swelling, or itching at the site of the injection (about 1 person in 3).  Headache (about 1 person in 70). Like all vaccines, shingles vaccine is being closely monitored for unusual or severe problems. WHAT IF THERE IS A MODERATE OR SEVERE REACTION? What should I look for? Any unusual condition, such as a severe allergic reaction or a high fever. If a severe allergic reaction   occurred, it would be within a few minutes to an hour after the shot. Signs of a serious allergic reaction can include difficulty breathing, weakness, hoarseness or wheezing, a fast heartbeat, hives, dizziness, paleness, or swelling of the throat. What should I do?  Call your caregiver, or get the person to a caregiver right away.  Tell the caregiver what  happened, the date and time it happened, and when the vaccination was given.  Ask the caregiver to report the reaction by filing a Vaccine Adverse Event Reporting System (VAERS) form. Or, you can file this report through the VAERS web site at www.vaers.hhs.gov or by calling 1-800-822-7967. VAERS does not provide medical advice. HOW CAN I LEARN MORE?  Ask your caregiver. He or she can give you the vaccine package insert or suggest other sources of information.  Contact the Centers for Disease Control and Prevention (CDC):  Call 1-800-232-4636 (1-800-CDC-INFO).  Visit the CDC website at www.cdc.gov/vaccines CDC Shingles Vaccine VIS (09/28/08) Document Released: 10/07/2006 Document Revised: 03/03/2012 Document Reviewed: 09/28/2008 Exit[employer] Patient Information 2013 [employer].  

## 2013-01-06 NOTE — Telephone Encounter (Signed)
Patient is currently in the office for 11:00am RGCE.

## 2013-01-07 ENCOUNTER — Other Ambulatory Visit: Payer: Self-pay | Admitting: Gynecology

## 2013-01-07 DIAGNOSIS — E559 Vitamin D deficiency, unspecified: Secondary | ICD-10-CM

## 2013-01-07 LAB — URINALYSIS W MICROSCOPIC + REFLEX CULTURE
Bilirubin Urine: NEGATIVE
Casts: NONE SEEN
Glucose, UA: NEGATIVE mg/dL
Hgb urine dipstick: NEGATIVE
Leukocytes, UA: NEGATIVE
Protein, ur: NEGATIVE mg/dL
Squamous Epithelial / LPF: NONE SEEN
pH: 5.5 (ref 5.0–8.0)

## 2013-01-07 MED ORDER — ERGOCALCIFEROL 1.25 MG (50000 UT) PO CAPS: 50000.0000 [IU] | ORAL_CAPSULE | ORAL | Status: DC

## 2013-07-14 ENCOUNTER — Encounter: Payer: Self-pay | Admitting: Anesthesiology

## 2013-11-17 DIAGNOSIS — Z86010 Personal history of colonic polyps: Secondary | ICD-10-CM | POA: Diagnosis not present

## 2013-11-17 DIAGNOSIS — K5900 Constipation, unspecified: Secondary | ICD-10-CM | POA: Diagnosis not present

## 2013-11-17 DIAGNOSIS — K219 Gastro-esophageal reflux disease without esophagitis: Secondary | ICD-10-CM | POA: Diagnosis not present

## 2013-11-17 DIAGNOSIS — Z1211 Encounter for screening for malignant neoplasm of colon: Secondary | ICD-10-CM | POA: Diagnosis not present

## 2013-11-17 DIAGNOSIS — Z800 Family history of malignant neoplasm of digestive organs: Secondary | ICD-10-CM | POA: Diagnosis not present

## 2013-11-17 DIAGNOSIS — R1310 Dysphagia, unspecified: Secondary | ICD-10-CM | POA: Diagnosis not present

## 2014-01-07 ENCOUNTER — Encounter: Payer: Self-pay | Admitting: Gynecology

## 2014-01-07 ENCOUNTER — Ambulatory Visit (INDEPENDENT_AMBULATORY_CARE_PROVIDER_SITE_OTHER): Payer: Medicare Other | Admitting: Gynecology

## 2014-01-07 VITALS — BP 114/70 | Ht 59.75 in | Wt 177.6 lb

## 2014-01-07 DIAGNOSIS — Z8639 Personal history of other endocrine, nutritional and metabolic disease: Secondary | ICD-10-CM

## 2014-01-07 DIAGNOSIS — M899 Disorder of bone, unspecified: Secondary | ICD-10-CM

## 2014-01-07 DIAGNOSIS — F419 Anxiety disorder, unspecified: Secondary | ICD-10-CM

## 2014-01-07 DIAGNOSIS — G4700 Insomnia, unspecified: Secondary | ICD-10-CM

## 2014-01-07 DIAGNOSIS — E039 Hypothyroidism, unspecified: Secondary | ICD-10-CM | POA: Diagnosis not present

## 2014-01-07 DIAGNOSIS — M949 Disorder of cartilage, unspecified: Secondary | ICD-10-CM

## 2014-01-07 DIAGNOSIS — F329 Major depressive disorder, single episode, unspecified: Secondary | ICD-10-CM

## 2014-01-07 DIAGNOSIS — E559 Vitamin D deficiency, unspecified: Secondary | ICD-10-CM | POA: Diagnosis not present

## 2014-01-07 DIAGNOSIS — N952 Postmenopausal atrophic vaginitis: Secondary | ICD-10-CM

## 2014-01-07 DIAGNOSIS — N951 Menopausal and female climacteric states: Secondary | ICD-10-CM | POA: Diagnosis not present

## 2014-01-07 DIAGNOSIS — F3289 Other specified depressive episodes: Secondary | ICD-10-CM

## 2014-01-07 DIAGNOSIS — Z780 Asymptomatic menopausal state: Secondary | ICD-10-CM

## 2014-01-07 DIAGNOSIS — F32A Depression, unspecified: Secondary | ICD-10-CM

## 2014-01-07 DIAGNOSIS — F411 Generalized anxiety disorder: Secondary | ICD-10-CM

## 2014-01-07 DIAGNOSIS — M8580 Other specified disorders of bone density and structure, unspecified site: Secondary | ICD-10-CM

## 2014-01-07 LAB — TSH: TSH: 1.452 u[IU]/mL (ref 0.350–4.500)

## 2014-01-07 MED ORDER — LEVOTHYROXINE SODIUM 100 MCG PO TABS: 100.0000 ug | ORAL_TABLET | Freq: Every day | ORAL | Status: DC

## 2014-01-07 MED ORDER — PAROXETINE HCL ER 25 MG PO TB24: 25.0000 mg | ORAL_TABLET | ORAL | Status: DC

## 2014-01-07 MED ORDER — NONFORMULARY OR COMPOUNDED ITEM: Status: DC

## 2014-01-07 MED ORDER — ESZOPICLONE 1 MG PO TABS: 1.0000 mg | ORAL_TABLET | Freq: Every evening | ORAL | Status: DC | PRN

## 2014-01-07 NOTE — Patient Instructions (Signed)
Eszopiclone tablets What is this medicine? ESZOPICLONE (es ZOE pi clone) is used to treat insomnia. This medicine helps you to fall asleep and sleep through the night. This medicine may be used for other purposes; ask your health care provider or pharmacist if you have questions. COMMON BRAND NAME(S): Lunesta What should I tell my health care provider before I take this medicine? They need to know if you have any of these conditions: -depression -history of a drug or alcohol abuse problem -liver disease -lung or breathing disease -suicidal thoughts -an unusual or allergic reaction to eszopiclone, other medicines, foods, dyes, or preservatives -pregnant or trying to get pregnant -breast-feeding How should I use this medicine? Take this medicine by mouth with a glass of water. Follow the directions on the prescription label. It is better to take this medicine on an empty stomach and only when you are ready for bed. Do not take your medicine more often than directed. If you have been taking this medicine for several weeks and suddenly stop taking it, you may get unpleasant withdrawal symptoms. Your doctor or health care professional may want to gradually reduce the dose. Do not stop taking this medicine on your own. Always follow your doctor or health care professional's advice. Talk to your pediatrician regarding the use of this medicine in children. Special care may be needed. Overdosage: If you think you have taken too much of this medicine contact a poison control center or emergency room at once. NOTE: This medicine is only for you. Do not share this medicine with others. What if I miss a dose? This does not apply. This medicine should only be taken immediately before going to sleep. Do not take double or extra doses. What may interact with this medicine? -herbal medicines like kava kava, melatonin, St. John's wort and valerian -lorazepam -medicines for fungal infections like ketoconazole,  fluconazole, or itraconazole -olanzapine This list may not describe all possible interactions. Give your health care provider a list of all the medicines, herbs, non-prescription drugs, or dietary supplements you use. Also tell them if you smoke, drink alcohol, or use illegal drugs. Some items may interact with your medicine. What should I watch for while using this medicine? Visit your doctor or health care professional for regular checks on your progress. Keep a regular sleep schedule by going to bed at about the same time nightly. Avoid caffeine-containing drinks in the evening hours, as caffeine can cause trouble with falling asleep. Talk to your doctor if you still have trouble sleeping. Do not take this medicine unless you are able to get a full night's sleep before you must be active again. You may not be able to remember things that you do in the hours after you take this medicine. Some people have reported driving, making phone calls, or preparing and eating food while asleep after taking sleep medicine. Take this medicine right before going to sleep. Tell your doctor if you have any problems with your memory. After you stop taking this medicine, you may notice some trouble falling asleep. This is called rebound insomnia. This problem usually goes away on its own after 1 or 2 nights. You may get drowsy or dizzy. Do not drive, use machinery, or do anything that needs mental alertness until you know how this medicine affects you. Do not stand or sit up quickly, especially if you are an older patient. This reduces the risk of dizzy or fainting spells. Alcohol may interfere with the effect of this medicine.  Avoid alcoholic drinks. This medicine may cause a decrease in mental alertness the day after use, even if you feel that you are fully awake. Tell your doctor if you will need to perform activities requiring full alertness, such as driving, the next day after you have taken this medicine. What side  effects may I notice from receiving this medicine? Side effects that you should report to your doctor or health care professional as soon as possible: -allergic reactions like skin rash, itching or hives, swelling of the face, lips, or tongue -changes in vision -confusion -depressed mood -feeling faint or lightheaded, falls -hallucinations -problems with balance, speaking, walking -restlessness, excitability, or feelings of agitation -unusual activities while asleep like driving, eating, making phone calls Side effects that usually do not require medical attention (report to your doctor or health care professional if they continue or are bothersome): -dizziness, or daytime drowsiness, sometimes called a hangover effect -headache This list may not describe all possible side effects. Call your doctor for medical advice about side effects. You may report side effects to FDA at 1-800-FDA-1088. Where should I keep my medicine? Keep out of the reach of children. This medicine can be abused. Keep your medicine in a safe place to protect it from theft. Do not share this medicine with anyone. Selling or giving away this medicine is dangerous and against the law. Store at room temperature between 15 and 30 degrees C (59 and 86 degrees F). Throw away any unused medicine after the expiration date. NOTE: This sheet is a summary. It may not cover all possible information. If you have questions about this medicine, talk to your doctor, pharmacist, or health care provider.  2014, [employer]vier/[employer] Standard. (2013-05-07 17:42:58)

## 2014-01-07 NOTE — Progress Notes (Signed)
[name] Apr 21, 1946 [id number]   History:    68 y.o.  for annual gyn exam who has suffered in the past from insomnia. 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 The patient as of this date has not had her colonoscopy which now has been rescheduled for next month. She has not had a mammogram since 2012. Patient denies any past history of abnormal Pap smears. She takes Paxil for her anxiety. She does have history of hypothyroidism and is on Synthroid 100 mcg daily. Patient not certain what labs were drawn by her gastroenterologist a few weeks ago and we will need to obtain those results. She has had history of vitamin D deficiency in the past. Patient is single and flu vaccine are both up-to-date. Patient has had history of cystocele and rectocele repair along with sling procedure for stress urinary incontinence and has done well. Patient also has had history of laparoscopic BSO.  Past medical history,surgical history, family history and social history were all reviewed and documented in the EPIC chart.  Gynecologic History Patient's last menstrual period was 07/18/2007. Contraception: post menopausal status Last Pap: 2013. Results were: normal Last mammogram: 2012. Results were: normal  Obstetric History OB History  Gravida Para Term Preterm AB SAB TAB Ectopic Multiple Living  6 3 3  3 3    3     # Outcome Date GA Lbr Len/2nd Weight Sex Delivery Anes PTL Lv  6 TRM 10/10/83    F SVD  N Y  5 TRM 11/10/75    M SVD  N Y  4 TRM 04/04/72    F SVD  N Y  3 SAB           2 SAB           1 SAB                ROS: A ROS was performed and pertinent positives and negatives are included in the history.  GENERAL: No fevers or chills. HEENT: No change in vision, no earache, sore throat or sinus congestion. NECK: No pain or stiffness. CARDIOVASCULAR: No chest pain or pressure. No palpitations. PULMONARY: No shortness of breath, cough or wheeze. GASTROINTESTINAL: No abdominal pain,  nausea, vomiting or diarrhea, melena or bright red blood per rectum. GENITOURINARY: No urinary frequency, urgency, hesitancy or dysuria. MUSCULOSKELETAL: No joint or muscle pain, no back pain, no recent trauma. DERMATOLOGIC: No rash, no itching, no lesions. ENDOCRINE: No polyuria, polydipsia, no heat or cold intolerance. No recent change in weight. HEMATOLOGICAL: No anemia or easy bruising or bleeding. NEUROLOGIC: No headache, seizures, numbness, tingling or weakness. PSYCHIATRIC: No depression, no loss of interest in normal activity or change in sleep pattern.     Exam: chaperone present  BP 114/70  Ht 4' 11.75" (1.518 m)  Wt 177 lb 9.6 oz (80.559 kg)  BMI 34.96 kg/m2  LMP 07/18/2007  Body mass index is 34.96 kg/(m^2).  General appearance : Well developed well nourished female. No acute distress HEENT: Neck supple, trachea midline, no carotid bruits, no thyroidmegaly Lungs: Clear to auscultation, no rhonchi or wheezes, or rib retractions  Heart: Regular rate and rhythm, no murmurs or gallops Breast:Examined in sitting and supine position were symmetrical in appearance, no palpable masses or tenderness,  no skin retraction, no nipple inversion, no nipple discharge, no skin discoloration, no axillary or supraclavicular lymphadenopathy Abdomen: no palpable masses or tenderness, no rebound or guarding Extremities: no edema or skin  discoloration or tenderness  Pelvic:  Bartholin, Urethra, Skene Glands: Within normal limits             Vagina: No gross lesions or discharge  Cervix: No gross lesions or discharge  Uterus  anteverted, normal size, shape and consistency, non-tender and mobile  Adnexa  Without masses or tenderness  Anus and perineum  normal   Rectovaginal  normal sphincter tone without palpated masses or tenderness             Hemoccult colonoscopy next month     Assessment/Plan:  68 y.o. female for annual exam who is her mind and once again the importance of proceeding with  her colonoscopy in February. She was provided with a requisition to schedule her mammogram as well as her bone density. We discussed importance of calcium and vitamin D for osteoporosis prevention. We'll check her vitamin D level today along with her TSH. We're going to make her request to her gastroenterologist descensus the labs that were drawn in the event that a fasting lipid profile or comprehensive metabolic panel has not been done so that we may or rate when she comes in for her bone density study. Pap smear was not done today in accordance to the new guidelines.  Note: This dictation was prepared with  Dragon/digital dictation along withSmart phrase technology. Any transcriptional errors that result from this process are unintentional.   Ok EdwardsFERNANDEZ,Ramaya Guile H MD, 11:58 AM 01/07/2014

## 2014-01-08 ENCOUNTER — Other Ambulatory Visit: Payer: Self-pay | Admitting: Gynecology

## 2014-01-08 DIAGNOSIS — E559 Vitamin D deficiency, unspecified: Secondary | ICD-10-CM

## 2014-01-08 LAB — VITAMIN D 25 HYDROXY (VIT D DEFICIENCY, FRACTURES): VIT D 25 HYDROXY: 20 ng/mL — AB (ref 30–89)

## 2014-01-08 MED ORDER — VITAMIN D (ERGOCALCIFEROL) 1.25 MG (50000 UNIT) PO CAPS: 50000.0000 [IU] | ORAL_CAPSULE | ORAL | Status: DC

## 2014-01-08 MED ORDER — ERGOCALCIFEROL 1.25 MG (50000 UT) PO CAPS: 50000.0000 [IU] | ORAL_CAPSULE | ORAL | Status: DC

## 2014-01-27 DIAGNOSIS — Z86010 Personal history of colonic polyps: Secondary | ICD-10-CM | POA: Diagnosis not present

## 2014-01-27 DIAGNOSIS — K219 Gastro-esophageal reflux disease without esophagitis: Secondary | ICD-10-CM | POA: Diagnosis not present

## 2014-01-27 DIAGNOSIS — K5730 Diverticulosis of large intestine without perforation or abscess without bleeding: Secondary | ICD-10-CM | POA: Diagnosis not present

## 2014-01-27 DIAGNOSIS — Z1211 Encounter for screening for malignant neoplasm of colon: Secondary | ICD-10-CM | POA: Diagnosis not present

## 2014-01-27 DIAGNOSIS — Z800 Family history of malignant neoplasm of digestive organs: Secondary | ICD-10-CM | POA: Diagnosis not present

## 2014-01-27 DIAGNOSIS — R1310 Dysphagia, unspecified: Secondary | ICD-10-CM | POA: Diagnosis not present

## 2014-01-27 DIAGNOSIS — K2100 Gastro-esophageal reflux disease with esophagitis, without bleeding: Secondary | ICD-10-CM | POA: Diagnosis not present

## 2014-01-31 DIAGNOSIS — N3000 Acute cystitis without hematuria: Secondary | ICD-10-CM | POA: Diagnosis not present

## 2014-02-21 HISTORY — PX: COLONOSCOPY: SHX174

## 2014-02-25 ENCOUNTER — Ambulatory Visit: Payer: Medicare Other

## 2014-02-25 ENCOUNTER — Other Ambulatory Visit: Payer: Medicare Other

## 2014-06-13 DIAGNOSIS — J019 Acute sinusitis, unspecified: Secondary | ICD-10-CM | POA: Diagnosis not present

## 2014-06-13 DIAGNOSIS — S8010XA Contusion of unspecified lower leg, initial encounter: Secondary | ICD-10-CM | POA: Diagnosis not present

## 2014-06-13 DIAGNOSIS — S8000XA Contusion of unspecified knee, initial encounter: Secondary | ICD-10-CM | POA: Diagnosis not present

## 2014-06-13 DIAGNOSIS — J209 Acute bronchitis, unspecified: Secondary | ICD-10-CM | POA: Diagnosis not present

## 2014-08-12 DIAGNOSIS — H2589 Other age-related cataract: Secondary | ICD-10-CM | POA: Diagnosis not present

## 2014-09-29 DIAGNOSIS — J309 Allergic rhinitis, unspecified: Secondary | ICD-10-CM | POA: Diagnosis not present

## 2014-09-29 DIAGNOSIS — J209 Acute bronchitis, unspecified: Secondary | ICD-10-CM | POA: Diagnosis not present

## 2014-10-25 ENCOUNTER — Encounter: Payer: Self-pay | Admitting: Gynecology

## 2014-12-19 DIAGNOSIS — M5430 Sciatica, unspecified side: Secondary | ICD-10-CM | POA: Diagnosis not present

## 2015-01-26 ENCOUNTER — Encounter: Payer: Self-pay | Admitting: Gynecology

## 2015-01-26 ENCOUNTER — Ambulatory Visit (INDEPENDENT_AMBULATORY_CARE_PROVIDER_SITE_OTHER): Payer: Medicare Other | Admitting: Gynecology

## 2015-01-26 VITALS — BP 130/86 | Ht 59.25 in | Wt 184.0 lb

## 2015-01-26 DIAGNOSIS — F419 Anxiety disorder, unspecified: Secondary | ICD-10-CM

## 2015-01-26 DIAGNOSIS — Z8639 Personal history of other endocrine, nutritional and metabolic disease: Secondary | ICD-10-CM | POA: Diagnosis not present

## 2015-01-26 DIAGNOSIS — E038 Other specified hypothyroidism: Secondary | ICD-10-CM | POA: Diagnosis not present

## 2015-01-26 DIAGNOSIS — M8580 Other specified disorders of bone density and structure, unspecified site: Secondary | ICD-10-CM | POA: Diagnosis not present

## 2015-01-26 DIAGNOSIS — F32A Depression, unspecified: Secondary | ICD-10-CM

## 2015-01-26 DIAGNOSIS — Z01419 Encounter for gynecological examination (general) (routine) without abnormal findings: Secondary | ICD-10-CM

## 2015-01-26 DIAGNOSIS — F329 Major depressive disorder, single episode, unspecified: Secondary | ICD-10-CM | POA: Diagnosis not present

## 2015-01-26 MED ORDER — PAROXETINE HCL ER 25 MG PO TB24: 25.0000 mg | ORAL_TABLET | ORAL | Status: DC

## 2015-01-26 MED ORDER — LEVOTHYROXINE SODIUM 100 MCG PO TABS: 100.0000 ug | ORAL_TABLET | Freq: Every day | ORAL | Status: DC

## 2015-01-26 NOTE — Progress Notes (Signed)
[name] 04/28/1946 [id number]   History:    69 y.o.  for annual gyn exam who was complaining of at times having lower extremity edema. She denies any shortness of breath or any tightness in her chest. She has not been established with a primary care physician. She recently had a colonoscopy and EGD and no polyps were detected although in the past she has had colon polyps only evidence of diverticulosis. From her EGD she was found to have reflux esophagitis and her complaint has been dysphasia. Patient's last bone density study was in 2012 whereby she was noted to have a T score -1.7 the AP spine indicating decreased bone mineralization (osteopenia). Patient requesting flu vaccine today. Patient is overdue for her mammogram she has not had one in many years.  Patient has had history of cystocele and rectocele repair along with sling procedure for stress urinary incontinence and has done well. Patient also has had history of laparoscopic BSO. Patient with past history vitamin D deficiency as well as hypothyroidism.  Past medical history,surgical history, family history and social history were all reviewed and documented in the EPIC chart.  Gynecologic History Patient's last menstrual period was 07/18/2007. Contraception: post menopausal status Last Pap: 2012. Results were: normal Last mammogram: Many years ago. Results were: normal  Obstetric History OB History  Gravida Para Term Preterm AB SAB TAB Ectopic Multiple Living  6 3 3  3 3    3     # Outcome Date GA Lbr Len/2nd Weight Sex Delivery Anes PTL Lv  6 Term 10/10/83    F Vag-Spont  N Y  5 Term 11/10/75    M Vag-Spont  N Y  4 Term 04/04/72    F Vag-Spont  N Y  3 SAB           2 SAB           1 SAB                ROS: A ROS was performed and pertinent positives and negatives are included in the history.  GENERAL: No fevers or chills. HEENT: No change in vision, no earache, sore throat or sinus congestion. NECK: No pain or  stiffness. CARDIOVASCULAR: No chest pain or pressure. No palpitations. PULMONARY: No shortness of breath, cough or wheeze. GASTROINTESTINAL: No abdominal pain, nausea, vomiting or diarrhea, melena or bright red blood per rectum. GENITOURINARY: No urinary frequency, urgency, hesitancy or dysuria. MUSCULOSKELETAL: No joint or muscle pain, no back pain, no recent trauma. DERMATOLOGIC: No rash, no itching, no lesions. ENDOCRINE: No polyuria, polydipsia, no heat or cold intolerance. No recent change in weight. HEMATOLOGICAL: No anemia or easy bruising or bleeding. NEUROLOGIC: No headache, seizures, numbness, tingling or weakness. PSYCHIATRIC: No depression, no loss of interest in normal activity or change in sleep pattern.     Exam: chaperone present  BP 130/86 mmHg  Ht 4' 11.25" (1.505 m)  Wt 184 lb (83.462 kg)  BMI 36.85 kg/m2  LMP 07/18/2007  Body mass index is 36.85 kg/(m^2).  General appearance : Well developed well nourished female. No acute distress HEENT: Neck supple, trachea midline, no carotid bruits, no thyroidmegaly Lungs: Clear to auscultation, no rhonchi or wheezes, or rib retractions  Heart: Regular rate and rhythm, no murmurs or gallops Breast:Examined in sitting and supine position were symmetrical in appearance, no palpable masses or tenderness,  no skin retraction, no nipple inversion, no nipple discharge, no skin discoloration, no axillary or supraclavicular lymphadenopathy  Abdomen: no palpable masses or tenderness, no rebound or guarding Extremities: no edema or skin discoloration or tenderness  Pelvic:  Bartholin, Urethra, Skene Glands: Within normal limits             Vagina: No gross lesions or discharge, atrophic changes  Cervix: No gross lesions or discharge  Uterus  anteverted, normal size, shape and consistency, non-tender and mobile  Adnexa  Without masses or tenderness  Anus and perineum  normal   Rectovaginal  normal sphincter tone without palpated masses or  tenderness             Hemoccult recent colonoscopy     Assessment/Plan:  69 y.o. female for annual exam who will be provided with names of several primary care physician in the community for her to schedule an appointment. She had no pitting edema noted today and her blood pressure was normal. She was otherwise asymptomatic. Because of her history of hypothyroidism a TSH will be checked today and because of her past history vitamin D deficiency will check a vitamin D level today. Patient no longer will need Pap smears. Patient to schedule her overdue bone density study as well as mammogram.   Ok Edwards MD, 11:45 AM 01/26/2015

## 2015-01-26 NOTE — Patient Instructions (Addendum)
Influenza Virus Vaccine (Flucelvax) What is this medicine? INFLUENZA VIRUS VACCINE (in floo EN zuh VAHY ruhs vak SEEN) helps to reduce the risk of getting influenza also known as the flu. The vaccine only helps protect you against some strains of the flu. This medicine may be used for other purposes; ask your health care provider or pharmacist if you have questions. COMMON BRAND NAME(S): FLUCELVAX What should I tell my health care provider before I take this medicine? They need to know if you have any of these conditions: -bleeding disorder like hemophilia -fever or infection -Guillain-Barre syndrome or other neurological problems -immune system problems -infection with the human immunodeficiency virus (HIV) or AIDS -low blood platelet counts -multiple sclerosis -an unusual or allergic reaction to influenza virus vaccine, other medicines, foods, dyes or preservatives -pregnant or trying to get pregnant -breast-feeding How should I use this medicine? This vaccine is for injection into a muscle. It is given by a health care professional. A copy of Vaccine Information Statements will be given before each vaccination. Read this sheet carefully each time. The sheet may change frequently. Talk to your pediatrician regarding the use of this medicine in children. Special care may be needed. Overdosage: If you think you've taken too much of this medicine contact a poison control center or emergency room at once. Overdosage: If you think you have taken too much of this medicine contact a poison control center or emergency room at once. NOTE: This medicine is only for you. Do not share this medicine with others. What if I miss a dose? This does not apply. What may interact with this medicine? -chemotherapy or radiation therapy -medicines that lower your immune system like etanercept, anakinra, infliximab, and adalimumab -medicines that treat or prevent blood clots like  warfarin -phenytoin -steroid medicines like prednisone or cortisone -theophylline -vaccines This list may not describe all possible interactions. Give your health care provider a list of all the medicines, herbs, non-prescription drugs, or dietary supplements you use. Also tell them if you smoke, drink alcohol, or use illegal drugs. Some items may interact with your medicine. What should I watch for while using this medicine? Report any side effects that do not go away within 3 days to your doctor or health care professional. Call your health care provider if any unusual symptoms occur within 6 weeks of receiving this vaccine. You may still catch the flu, but the illness is not usually as bad. You cannot get the flu from the vaccine. The vaccine will not protect against colds or other illnesses that may cause fever. The vaccine is needed every year. What side effects may I notice from receiving this medicine? Side effects that you should report to your doctor or health care professional as soon as possible: -allergic reactions like skin rash, itching or hives, swelling of the face, lips, or tongue Side effects that usually do not require medical attention (Report these to your doctor or health care professional if they continue or are bothersome.): -fever -headache -muscle aches and pains -pain, tenderness, redness, or swelling at the injection site -tiredness This list may not describe all possible side effects. Call your doctor for medical advice about side effects. You may report side effects to FDA at 1-800-FDA-1088. Where should I keep my medicine? The vaccine will be given by a health care professional in a clinic, pharmacy, doctor's office, or other health care setting. You will not be given vaccine doses to store at home. NOTE: This sheet is a summary.   It may not cover all possible information. If you have questions about this medicine, talk to your doctor, pharmacist, or health care  provider.  2015, Elsevier/Gold Standard. (2011-11-21 14:06:47) Bone Densitometry Bone densitometry is a special X-ray that measures your bone density and can be used to help predict your risk of bone fractures. This test is used to determine bone mineral content and density to diagnose osteoporosis. Osteoporosis is the loss of bone that may cause the bone to become weak. Osteoporosis commonly occurs in women entering menopause. However, it may be found in men and in people with other diseases. PREPARATION FOR TEST No preparation necessary. WHO SHOULD BE TESTED?  All women older than 7365.  Postmenopausal women (50 to 8565) with risk factors for osteoporosis.  People with a previous fracture caused by normal activities.  People with a small body frame (less than 127 poundsor a body mass index [BMI] of less than 21).  People who have a parent with a hip fracture or history of osteoporosis.  People who smoke.  People who have rheumatoid arthritis.  Anyone who engages in excessive alcohol use (more than 3 drinks most days).  Women who experience early menopause. WHEN SHOULD YOU BE RETESTED? Current guidelines suggest that you should wait at least 2 years before doing a bone density test again if your first test was normal.Recent studies indicated that women with normal bone density may be able to wait a few years before needing to repeat a bone density test. You should discuss this with your caregiver.  NORMAL FINDINGS   Normal: less than standard deviation below normal (greater than -1).  Osteopenia: 1 to 2.5 standard deviations below normal (-1 to -2.5).  Osteoporosis: greater than 2.5 standard deviations below normal (less than -2.5). Test results are reported as a "T score" and a "Z score."The T score is a number that compares your bone density with the bone density of healthy, young women.The Z score is a number that compares your bone density with the scores of women who are the  same age, gender, and race.  Ranges for normal findings may vary among different laboratories and hospitals. You should always check with your doctor after having lab work or other tests done to discuss the meaning of your test results and whether your values are considered within normal limits. MEANING OF TEST  Your caregiver will go over the test results with you and discuss the importance and meaning of your results, as well as treatment options and the need for additional tests if necessary. OBTAINING THE TEST RESULTS It is your responsibility to obtain your test results. Ask the lab or department performing the test when and how you will get your results. Document Released: 01/01/2005 Document Revised: 03/03/2012 Document Reviewed: 01/24/2011 [facility] Patient Information 2015 [employer], [place]. This information is not intended to replace advice given to you by your health care provider. Make sure you discuss any questions you have with your health care provider.

## 2015-01-27 LAB — TSH: TSH: 1.797 u[IU]/mL (ref 0.350–4.500)

## 2015-01-27 LAB — VITAMIN D 25 HYDROXY (VIT D DEFICIENCY, FRACTURES): VIT D 25 HYDROXY: 19 ng/mL — AB (ref 30–100)

## 2015-02-04 ENCOUNTER — Other Ambulatory Visit: Payer: Self-pay | Admitting: Gynecology

## 2015-02-04 ENCOUNTER — Encounter: Payer: Self-pay | Admitting: Gynecology

## 2015-02-04 DIAGNOSIS — F329 Major depressive disorder, single episode, unspecified: Secondary | ICD-10-CM

## 2015-02-04 DIAGNOSIS — F32A Depression, unspecified: Secondary | ICD-10-CM

## 2015-02-04 DIAGNOSIS — E559 Vitamin D deficiency, unspecified: Secondary | ICD-10-CM

## 2015-02-04 DIAGNOSIS — F419 Anxiety disorder, unspecified: Secondary | ICD-10-CM

## 2015-02-04 MED ORDER — PAROXETINE HCL ER 25 MG PO TB24: 25.0000 mg | ORAL_TABLET | ORAL | Status: DC

## 2015-02-04 MED ORDER — VITAMIN D (ERGOCALCIFEROL) 1.25 MG (50000 UNIT) PO CAPS: ORAL_CAPSULE | ORAL | Status: DC

## 2015-02-10 ENCOUNTER — Other Ambulatory Visit: Payer: Self-pay | Admitting: Gynecology

## 2015-02-10 ENCOUNTER — Ambulatory Visit (INDEPENDENT_AMBULATORY_CARE_PROVIDER_SITE_OTHER): Payer: Medicare Other

## 2015-02-10 DIAGNOSIS — M8580 Other specified disorders of bone density and structure, unspecified site: Secondary | ICD-10-CM

## 2015-02-10 DIAGNOSIS — Z13820 Encounter for screening for osteoporosis: Secondary | ICD-10-CM

## 2015-02-11 DIAGNOSIS — Z23 Encounter for immunization: Secondary | ICD-10-CM | POA: Diagnosis not present

## 2015-02-11 DIAGNOSIS — Z9181 History of falling: Secondary | ICD-10-CM | POA: Diagnosis not present

## 2015-02-11 DIAGNOSIS — R600 Localized edema: Secondary | ICD-10-CM | POA: Diagnosis not present

## 2015-02-11 DIAGNOSIS — Z0000 Encounter for general adult medical examination without abnormal findings: Secondary | ICD-10-CM | POA: Diagnosis not present

## 2015-02-11 DIAGNOSIS — Z1231 Encounter for screening mammogram for malignant neoplasm of breast: Secondary | ICD-10-CM | POA: Diagnosis not present

## 2015-02-11 DIAGNOSIS — Z1389 Encounter for screening for other disorder: Secondary | ICD-10-CM | POA: Diagnosis not present

## 2015-02-11 DIAGNOSIS — R5383 Other fatigue: Secondary | ICD-10-CM | POA: Diagnosis not present

## 2015-02-11 DIAGNOSIS — E039 Hypothyroidism, unspecified: Secondary | ICD-10-CM | POA: Diagnosis not present

## 2015-02-16 DIAGNOSIS — Z1231 Encounter for screening mammogram for malignant neoplasm of breast: Secondary | ICD-10-CM | POA: Diagnosis not present

## 2015-02-23 DIAGNOSIS — N63 Unspecified lump in breast: Secondary | ICD-10-CM | POA: Diagnosis not present

## 2015-02-23 DIAGNOSIS — R928 Other abnormal and inconclusive findings on diagnostic imaging of breast: Secondary | ICD-10-CM | POA: Diagnosis not present

## 2015-06-01 ENCOUNTER — Telehealth: Payer: Self-pay

## 2015-06-01 NOTE — Telephone Encounter (Signed)
[facility] 06/01/15 fbg.

## 2015-06-22 ENCOUNTER — Encounter: Payer: Self-pay | Admitting: Internal Medicine

## 2015-06-22 ENCOUNTER — Ambulatory Visit (INDEPENDENT_AMBULATORY_CARE_PROVIDER_SITE_OTHER): Payer: Medicare Other | Admitting: Internal Medicine

## 2015-06-22 VITALS — BP 130/88 | HR 72 | Ht 59.0 in | Wt 183.2 lb

## 2015-06-22 DIAGNOSIS — Z86010 Personal history of colonic polyps: Secondary | ICD-10-CM | POA: Insufficient documentation

## 2015-06-22 DIAGNOSIS — K5730 Diverticulosis of large intestine without perforation or abscess without bleeding: Secondary | ICD-10-CM

## 2015-06-22 DIAGNOSIS — Z800 Family history of malignant neoplasm of digestive organs: Secondary | ICD-10-CM | POA: Diagnosis not present

## 2015-06-22 DIAGNOSIS — R1310 Dysphagia, unspecified: Secondary | ICD-10-CM

## 2015-06-22 DIAGNOSIS — K219 Gastro-esophageal reflux disease without esophagitis: Secondary | ICD-10-CM | POA: Insufficient documentation

## 2015-06-22 DIAGNOSIS — Z860101 Personal history of adenomatous and serrated colon polyps: Secondary | ICD-10-CM | POA: Insufficient documentation

## 2015-06-22 MED ORDER — OMEPRAZOLE 40 MG PO CPDR: 40.0000 mg | DELAYED_RELEASE_CAPSULE | Freq: Every day | ORAL | Status: DC

## 2015-06-22 NOTE — Patient Instructions (Signed)
You have been scheduled for an endoscopy. Please follow written instructions given to you at your visit today. If you use inhalers (even only as needed), please bring them with you on the day of your procedure.   We have sent the following medications to your pharmacy for you to pick up at your convenience:Omeprazole 40mg  take daily 30 minutes before breakfast.  If you cannot afford this medication please call the office.  We have given you a discount card.  You are due for a Colonoscopy in Feb 2020, we will contact you.

## 2015-06-22 NOTE — Progress Notes (Signed)
Patient ID: [name], female   DOB: [birth year], 69 y.o.   MRN: [id number] HPI: [name] is a 69 year old female with past medical history of GERD, remote adenomatous colon polyp, diverticulosis, family history of colon cancer, hypothyroidism, anxiety and depression who is seen to evaluate dysphagia. She is here alone today. She reports over the last 1-2 years having developed solid food dysphagia. This seems to be somewhat intermittent. It can occur with liquids but occurs much more frequently with solids. She reports having to chew her food very well. At times food sticks in her esophagus and has to either be force down or brought back up. No history of prolonged food impaction. When food sticks it is often associated with belching and burping. She denies feeling heartburn. No nausea or vomiting. No early satiety or weight loss. She reports bowel movements have been regular without blood in her stool or melena. She denies abdominal pain. Family history notable for a paternal uncle and aunt with colorectal cancer.  She has had at least 3 prior colonoscopies performed by Dr. Loreta Ave. She also had endoscopy last year with Dr. Loreta Ave, see below  --Colonoscopy 01/28/2002 -- pathology only = adenomatous polyp from biopsy at 40 cm --Colonoscopy 02/19/2005 -- sigmoid diverticulosis, small internal hemorrhoids. --Colonoscopy 01/27/2014 -- diverticulosis throughout the colon, nonbleeding internal hemorrhoids   --EGD 01/27/2014 -- LA grade a reflux esophagitis, small hernia, otherwise normal  Past Medical History  Diagnosis Date  . Renal abscess     PAST HISTORY OF LEFT RENAL ABSCESS  . Hypothyroid   . Colon polyp 2003  . Anxiety   . Depression   . Osteopenia   . Vitamin D deficiency 12/2009  . Insomnia     Past Surgical History  Procedure Laterality Date  . Anterior and posterior repair  2001    WITH SLING  . Bladder suspension  2001    AT TIME OF A/P REPAIR  . Total knee arthroplasty     RIGHT  . Oophorectomy  11/26/2007    LAP BSO  . Colonoscopy  march 2015    Outpatient Prescriptions Prior to Visit  Medication Sig Dispense Refill  . Calcium Carbonate (CALTRATE 600 PO) Take by mouth.      . dextromethorphan-guaiFENesin (MUCINEX DM) 30-600 MG per 12 hr tablet Take 1 tablet by mouth 2 (two) times daily.    Marland Kitchen levothyroxine (SYNTHROID, LEVOTHROID) 100 MCG tablet Take 1 tablet (100 mcg total) by mouth daily. 90 tablet 4  . PARoxetine (PAXIL-CR) 25 MG 24 hr tablet Take 1 tablet (25 mg total) by mouth every morning. 90 tablet 4  . ergocalciferol (VITAMIN D2) 50000 UNITS capsule Take 1 capsule (50,000 Units total) by mouth once a week. 12 capsule 0  . eszopiclone (LUNESTA) 1 MG TABS tablet Take 1 tablet (1 mg total) by mouth at bedtime as needed for sleep. Take immediately before bedtime (Patient not taking: Reported on 01/26/2015) 30 tablet 5  . NONFORMULARY OR COMPOUNDED ITEM Estradiol .02% 1ml prefilled applicator Sig: apply twice a week 90 Day supply  refill x 4 90 each 4  . Vitamin D, Ergocalciferol, (DRISDOL) 50000 UNITS CAPS capsule Take 1 capsule (50,000 Units total) by mouth every 30 (thirty) days. 12 capsule 0  . Vitamin D, Ergocalciferol, (DRISDOL) 50000 UNITS CAPS capsule Take one tablet EVERY OTHER WEEK. 12 capsule 1   No facility-administered medications prior to visit.    Allergies  Allergen Reactions  . Naproxen Anaphylaxis    SUICIDAL THOUGHTS  .  Ether   . Lidocaine   . Lidocaine Hcl     Family History  Problem Relation Age of Onset  . Cancer Mother     LUNG CA-SMOKER-DIED AT 3361 WITH IT  . Cancer Father     MALIGNANT MELANOMA--DIED AT AGE 77 WITH IT  . Cancer Paternal Aunt     ovarian   . Heart disease Maternal Grandmother   . Hypertension Maternal Grandmother   . Other Sister     legendarie's  . Cirrhosis Brother     alcholic    History  Substance Use Topics  . Smoking status: Never Smoker   . Smokeless tobacco: Never Used  . Alcohol Use:  No    ROS: As per history of present illness, otherwise negative  BP 130/88 mmHg  Pulse 72  Ht 4\' 11"  (1.499 m)  Wt 183 lb 3.2 oz (83.099 kg)  BMI 36.98 kg/m2  LMP 07/18/2007 Constitutional: Well-developed and well-nourished. No distress. HEENT: Normocephalic and atraumatic. Oropharynx is clear and moist. No oropharyngeal exudate. Conjunctivae are normal.  No scleral icterus. Neck: Neck supple. Trachea midline. Cardiovascular: Normal rate, regular rhythm and intact distal pulses. No M/R/G Pulmonary/chest: Effort normal and breath sounds normal. No wheezing, rales or rhonchi. Abdominal: Soft, obese, nontender, nondistended. Bowel sounds active throughout. There are no masses palpable. No hepatosplenomegaly. Extremities: no clubbing, cyanosis, or edema Neurological: Alert and oriented to person place and time. Skin: Skin is warm and dry. No rashes noted. Psychiatric: Normal mood and affect. Behavior is normal.  RELEVANT studies -- see hpi  ASSESSMENT/PLAN:  69 year old female with past medical history of GERD, remote adenomatous colon polyp, diverticulosis, family history of colon cancer, hypothyroidism, anxiety and depression who is seen to evaluate dysphagia.  1. Solid food dysphagia and hx of reflux esophagitis -- given history of reflux esophagitis, I have recommended omeprazole 40 mg daily. Is possible that reflux esophagitis is causing mucosal edema and worsening dysphagia. Is also possible that she has developed a Schatzki's ring in the distal esophagus at the level of the hiatal hernia. I recommended repeat upper endoscopy with probable dilation. We discussed the test including the risks, benefits and alternatives and she is agreeable to proceed. We also discussed that if dilation is not help symptoms, esophageal manometry would be the next diagnostic test to consider.  2. Family history of colon cancer/history of adenomatous colon polyp -- her adenomatous polyp was remote and  she has had 2 normal subsequent colonoscopies. She has 2 second-degree relatives with colorectal cancer. Given family history and remote adenomatous polyp I recommend repeat surveillance colonoscopy 5 years from her last which would be February 2020. She is comfortable with this recommendation

## 2015-08-09 ENCOUNTER — Ambulatory Visit (AMBULATORY_SURGERY_CENTER): Payer: Medicare Other | Admitting: Internal Medicine

## 2015-08-09 ENCOUNTER — Encounter: Payer: Self-pay | Admitting: Internal Medicine

## 2015-08-09 VITALS — BP 148/77 | HR 55 | Temp 97.2°F | Resp 27 | Ht 59.0 in | Wt 183.0 lb

## 2015-08-09 DIAGNOSIS — R1310 Dysphagia, unspecified: Secondary | ICD-10-CM | POA: Diagnosis not present

## 2015-08-09 DIAGNOSIS — Q394 Esophageal web: Secondary | ICD-10-CM | POA: Diagnosis not present

## 2015-08-09 DIAGNOSIS — K222 Esophageal obstruction: Secondary | ICD-10-CM

## 2015-08-09 DIAGNOSIS — R1319 Other dysphagia: Secondary | ICD-10-CM

## 2015-08-09 DIAGNOSIS — K219 Gastro-esophageal reflux disease without esophagitis: Secondary | ICD-10-CM | POA: Diagnosis not present

## 2015-08-09 DIAGNOSIS — R1314 Dysphagia, pharyngoesophageal phase: Secondary | ICD-10-CM

## 2015-08-09 MED ORDER — SODIUM CHLORIDE 0.9 % IV SOLN
500.0000 mL | INTRAVENOUS | Status: DC
Start: 2015-08-09 — End: 2015-08-09

## 2015-08-09 NOTE — Progress Notes (Signed)
No problems noted in the recovery room. maw 

## 2015-08-09 NOTE — Op Note (Signed)
[facility] [address] [place], [address]   ENDOSCOPY PROCEDURE REPORT  PATIENT: [name], [name]  MR#: [id number] BIRTHDATE: 1946-04-10 , 68  yrs. old GENDER: female ENDOSCOPIST: Beverley Fiedler, MD PROCEDURE DATE:  08/09/2015 PROCEDURE:  EGD, diagnostic and EGD w/ balloon dilation ASA CLASS:     Class II INDICATIONS:  history of GERD and dysphagia. MEDICATIONS: Monitored anesthesia care and Propofol 360 mg IV TOPICAL ANESTHETIC: none  DESCRIPTION OF PROCEDURE: After the risks benefits and alternatives of the procedure were thoroughly explained, informed consent was obtained.  The LB [id number] endoscope was introduced through the mouth and advanced to the second portion of the duodenum , Without limitations.  The instrument was slowly withdrawn as the mucosa was fully examined.   ESOPHAGUS: A Schatzki ring was found 32 cm from the incisors at the top of a 4 cm hiatal hernia. Z-line was regular with no evidence for Barrett's esophagus. Using a TTS-balloon the stricture was dilated up to 17mm (mucosal inspection revealed no change after 14 mm, 15.5 mm, 16 mm balloon dilation).  The balloon was held inflated for 60 seconds.  Following this dilation, there was a small amount of heme.  STOMACH: The mucosa of the stomach appeared normal.  DUODENUM: The duodenal mucosa showed no abnormalities in the bulb and 2nd part of the duodenum.  Retroflexed views revealed as previously described.     The scope was then withdrawn from the patient and the procedure completed.  COMPLICATIONS: There were no immediate complications.  ENDOSCOPIC IMPRESSION: 1.   Schatzki ring at proximal edge of 4 cm hiatal hernia; balloon dilation to 17 mm 2.   The mucosa of the stomach appeared normal 3.   The duodenal mucosa showed no abnormalities in the bulb and 2nd part of the duodenum  RECOMMENDATIONS: 1.  Continue omeprazole 40 mg daily 2.  Office follow-up as needed and  repeat dilation as needed based on symptoms  eSigned:  Beverley Fiedler, MD 08/09/2015 4:00 PM    CC: the patient

## 2015-08-09 NOTE — Progress Notes (Signed)
A/ox3 pleased with MAC, report to Annette RN 

## 2015-08-09 NOTE — Patient Instructions (Signed)
YOU HAD AN ENDOSCOPIC PROCEDURE TODAY AT THE [facility]:   Refer to the procedure report that was given to you for any specific questions about what was found during the examination.  If the procedure report does not answer your questions, please call your gastroenterologist to clarify.  If you requested that your care partner not be given the details of your procedure findings, then the procedure report has been included in a sealed envelope for you to review at your convenience later.  YOU SHOULD EXPECT: Some feelings of bloating in the abdomen. Passage of more gas than usual.  Walking can help get rid of the air that was put into your GI tract during the procedure and reduce the bloating. If you had a lower endoscopy (such as a colonoscopy or flexible sigmoidoscopy) you may notice spotting of blood in your stool or on the toilet paper. If you underwent a bowel prep for your procedure, you may not have a normal bowel movement for a few days.  Please Note:  You might notice some irritation and congestion in your nose or some drainage.  This is from the oxygen used during your procedure.  There is no need for concern and it should clear up in a day or so.  SYMPTOMS TO REPORT IMMEDIATELY:    Following upper endoscopy (EGD)  Vomiting of blood or coffee ground material  New chest pain or pain under the shoulder blades  Painful or persistently difficult swallowing  New shortness of breath  Fever of 100F or higher  Black, tarry-looking stools  For urgent or emergent issues, a gastroenterologist can be reached at any hour by calling [phone].   DIET:   Drink plenty of fluids but you should avoid alcoholic beverages for 24 hours.  Please follow the dilatation diet the rest of the day.  Copy of diet given to your care partner.  ACTIVITY:  You should plan to take it easy for the rest of today and you should NOT DRIVE or use heavy machinery until tomorrow (because of the sedation  medicines used during the test).    FOLLOW UP: Our staff will call the number listed on your records the next business day following your procedure to check on you and address any questions or concerns that you may have regarding the information given to you following your procedure. If we do not reach you, we will leave a message.  However, if you are feeling well and you are not experiencing any problems, there is no need to return our call.  We will assume that you have returned to your regular daily activities without incident.  If any biopsies were taken you will be contacted by phone or by letter within the next 1-3 weeks.  Please call us at [phone] if you have not heard about the biopsies in 3 weeks.    SIGNATURES/CONFIDENTIALITY: You and/or your care partner have signed paperwork which will be entered into your electronic medical record.  These signatures attest to the fact that that the information above on your After Visit Summary has been reviewed and is understood.  Full responsibility of the confidentiality of this discharge information lies with you and/or your care-partner.    Handout given to your care partner on the dilatation diet to follow the rest of the day. You may resume your current meds today.  Continue omeprazole 40 mg daily. Please call if any questions or concerns.

## 2015-08-09 NOTE — Progress Notes (Signed)
Called to room to assist during endoscopic procedure.  Patient ID and intended procedure confirmed with present staff. Received instructions for my participation in the procedure from the performing physician.  

## 2015-08-10 ENCOUNTER — Telehealth: Payer: Self-pay | Admitting: *Deleted

## 2015-08-10 NOTE — Telephone Encounter (Signed)
  Follow up Call-  Call back number 08/09/2015  Post procedure Call Back phone  # [phone]  Permission to leave phone message Yes     Patient questions:  Do you have a fever, pain , or abdominal swelling? No. Pain Score  0 *  Have you tolerated food without any problems? Yes.    Have you been able to return to your normal activities? Yes.    Do you have any questions about your discharge instructions: Diet   No. Medications  No. Follow up visit  No.  Do you have questions or concerns about your Care? No.  Actions: * If pain score is 4 or above: No action needed, pain <4.

## 2015-10-21 DIAGNOSIS — H2513 Age-related nuclear cataract, bilateral: Secondary | ICD-10-CM | POA: Diagnosis not present

## 2015-11-22 DIAGNOSIS — Z23 Encounter for immunization: Secondary | ICD-10-CM | POA: Diagnosis not present

## 2015-12-15 ENCOUNTER — Telehealth: Payer: Self-pay | Admitting: Internal Medicine

## 2015-12-15 ENCOUNTER — Other Ambulatory Visit: Payer: Self-pay | Admitting: Internal Medicine

## 2015-12-16 NOTE — Telephone Encounter (Signed)
Rx sent 

## 2016-01-30 ENCOUNTER — Ambulatory Visit (INDEPENDENT_AMBULATORY_CARE_PROVIDER_SITE_OTHER): Payer: PPO | Admitting: Gynecology

## 2016-01-30 ENCOUNTER — Encounter: Payer: Self-pay | Admitting: Gynecology

## 2016-01-30 VITALS — BP 130/86 | Wt 187.0 lb

## 2016-01-30 DIAGNOSIS — M79629 Pain in unspecified upper arm: Secondary | ICD-10-CM

## 2016-01-30 DIAGNOSIS — M25529 Pain in unspecified elbow: Secondary | ICD-10-CM

## 2016-01-30 DIAGNOSIS — Z8639 Personal history of other endocrine, nutritional and metabolic disease: Secondary | ICD-10-CM | POA: Diagnosis not present

## 2016-01-30 DIAGNOSIS — M8580 Other specified disorders of bone density and structure, unspecified site: Secondary | ICD-10-CM | POA: Diagnosis not present

## 2016-01-30 DIAGNOSIS — Z01419 Encounter for gynecological examination (general) (routine) without abnormal findings: Secondary | ICD-10-CM | POA: Diagnosis not present

## 2016-01-30 DIAGNOSIS — F419 Anxiety disorder, unspecified: Secondary | ICD-10-CM

## 2016-01-30 DIAGNOSIS — E038 Other specified hypothyroidism: Secondary | ICD-10-CM | POA: Diagnosis not present

## 2016-01-30 DIAGNOSIS — M859 Disorder of bone density and structure, unspecified: Secondary | ICD-10-CM | POA: Diagnosis not present

## 2016-01-30 DIAGNOSIS — F329 Major depressive disorder, single episode, unspecified: Secondary | ICD-10-CM | POA: Diagnosis not present

## 2016-01-30 DIAGNOSIS — F32A Depression, unspecified: Secondary | ICD-10-CM

## 2016-01-30 LAB — TSH: TSH: 0.59 mIU/L

## 2016-01-30 LAB — RHEUMATOID FACTOR

## 2016-01-30 MED ORDER — LEVOTHYROXINE SODIUM 100 MCG PO TABS: 100.0000 ug | ORAL_TABLET | Freq: Every day | ORAL | Status: DC

## 2016-01-30 MED ORDER — PAROXETINE HCL ER 25 MG PO TB24: 25.0000 mg | ORAL_TABLET | ORAL | Status: DC

## 2016-01-30 NOTE — Progress Notes (Signed)
[name] 1946/09/24 [id number]   History:    70 y.o.  for annual gyn exam who has not followed up with her PCP which is Dr. Tomasa Blase. Patient with past history vitamin D deficiency and osteopenia. She also has history of hypothyroidism and suffers from anxiety and depression. She was complaining today of some times upper extremity numbness and achy sensation. Patient in August last year had esophageal dilatation to dysphagia and GERD.  Patient does have history of colon polyps. Her last colonoscopy was in 2005 were by also diverticulosis was diagnosed. Patient's last bone density study was in 2016 lowest T score was at the AP spine -2.2. Her right femoral neck -0.9 with a normal Frax analysis. When bone density study compare with 2012 there was statistically significant decrease of bone mineralization on the left femoral neck of -5.8% and AP and right femoral neck no significant change from previous study.  Patient has had history of cystocele and rectocele repair along with sling procedure for stress urinary incontinence and has done well. Patient also has had history of laparoscopic BSO. Patient with no prior history of abnormal Pap smears.  Past medical history,surgical history, family history and social history were all reviewed and documented in the EPIC chart.  Gynecologic History Patient's last menstrual period was 07/18/2007. Contraception: post menopausal status Last Pap: 2013. Results were: normal Last mammogram: 2016. Results were: normal  Obstetric History OB History  Gravida Para Term Preterm AB SAB TAB Ectopic Multiple Living  # Outcome Date GA Lbr Len/2nd Weight Sex Delivery Anes PTL Lv  6 Term 10/10/83    F Vag-Spont  N Y  5 Term 11/10/75    M Vag-Spont  N Y  4 Term 04/04/72    F Vag-Spont  N Y  3 SAB           2 SAB           1 SAB                ROS: A ROS was performed and pertinent positives and negatives are included in the  history.  GENERAL: No fevers or chills. HEENT: No change in vision, no earache, sore throat or sinus congestion. NECK: No pain or stiffness. CARDIOVASCULAR: No chest pain or pressure. No palpitations. PULMONARY: No shortness of breath, cough or wheeze. GASTROINTESTINAL: No abdominal pain, nausea, vomiting or diarrhea, melena or bright red blood per rectum. GENITOURINARY: No urinary frequency, urgency, hesitancy or dysuria. MUSCULOSKELETAL: No joint or muscle pain, no back pain, no recent trauma. DERMATOLOGIC: No rash, no itching, no lesions. ENDOCRINE: No polyuria, polydipsia, no heat or cold intolerance. No recent change in weight. HEMATOLOGICAL: No anemia or easy bruising or bleeding. NEUROLOGIC: No headache, seizures, numbness, tingling or weakness. PSYCHIATRIC: No depression, no loss of interest in normal activity or change in sleep pattern.     Exam: chaperone present  BP 130/86 mmHg  Wt 187 lb (84.823 kg)  LMP 07/18/2007  Body mass index is 37.75 kg/(m^2).  General appearance : Well developed well nourished female. No acute distress HEENT: Eyes: no retinal hemorrhage or exudates,  Neck supple, trachea midline, no carotid bruits, no thyroidmegaly Lungs: Clear to auscultation, no rhonchi or wheezes, or rib retractions  Heart: Regular rate and rhythm, no murmurs or gallops Breast:Examined in sitting and supine position were symmetrical in appearance, no palpable masses or tenderness,  no skin retraction,  no nipple inversion, no nipple discharge, no skin discoloration, no axillary or supraclavicular lymphadenopathy Abdomen: no palpable masses or tenderness, no rebound or guarding Extremities: no edema or skin discoloration or tenderness  Pelvic:  Bartholin, Urethra, Skene Glands: Within normal limits             Vagina: No gross lesions or discharge atrophic changes  Cervix: No gross lesions or discharge  Uterus  anteverted, normal size, shape and consistency, non-tender and  mobile  Adnexa  Without masses or tenderness  Anus and perineum  normal   Rectovaginal  normal sphincter tone without palpated masses or tenderness             Hemoccult PCP provides     Assessment/Plan:  70 y.o. female for annual exam who will need to contact her PCP for her annual medical exam and blood screen. Because of her joint pains were going to check a rheumatoid factor blood test today. Since she does have history of hypothyroidism and is on 100 g of Synthroid daily were going to check her TSH and she has not seen her PCP in over a year. Also because her past history vitamin D deficiency will check a vitamin D level. After she was treated last year with 50,000 units every weekly for 12 weeks she did not return back for follow-up vitamin D level. She was reminded on the importance of adhering to medical advice. We discussed importance of apparent exercises daily for osteoporosis prevention. Pap smear no longer indicated. Prescription refill for her Paxil and Synthroid was provided.   Ok Edwards MD, 12:16 PM 01/30/2016

## 2016-01-30 NOTE — Patient Instructions (Signed)
Rheumatoid Arthritis  Rheumatoid arthritis is a long-term (chronic) inflammatory disease that causes pain, swelling, and stiffness of the joints. It can affect the entire body, including the eyes and lungs. The effects of rheumatoid arthritis vary widely among those with the condition.  CAUSES  The cause of rheumatoid arthritis is not known. It tends to run in families and is more common in women. Certain cells of the body's natural defense system (immune system) do not work properly and begin to attack healthy joints. It primarily involves the connective tissue that lines the joints (synovial membrane). This can cause damage to the joint.  SYMPTOMS  · Pain, stiffness, swelling, and decreased motion of many joints, especially in the hands and feet.  · Stiffness that is worse in the morning. It may last 1-2 hours or longer.  · Numbness and tingling in the hands.  · Fatigue.  · Loss of appetite.  · Weight loss.  · Low-grade fever.  · Dry eyes and mouth.  · Firm lumps (rheumatoid nodules) that grow beneath the skin in areas such as the elbows and hands.  DIAGNOSIS  Diagnosis is based on the symptoms described, an exam, and blood tests. Sometimes, X-rays are helpful.  TREATMENT  The goals of treatment are to relieve pain, reduce inflammation, and to slow down or stop joint damage and disability. Methods vary and may include:  · Maintaining a balance of rest, exercise, and proper nutrition.  · Your health care provider may adjust your medicines every 3 months until treatment goals are reached. Common medicines include:    Pain relievers (analgesics).    Corticosteroids and nonsteroidal anti-inflammatory drugs (NSAIDs) to reduce inflammation.    Disease-modifying antirheumatic drugs (DMARDs) to try to slow the course of the disease.    Biologic response modifiers to reduce inflammation and damage.  · Physical therapy and occupational therapy.  · Surgery for patients with severe joint damage. Joint replacement or fusing of  joints may be needed.  · Routine monitoring and ongoing care, such as office visits, blood and urine tests, and X-rays.  Your health care provider will work with you to identify the best treatment option for you, based on an assessment of the overall disease activity in your body.  HOME CARE INSTRUCTIONS  · Remain physically active and reduce activity when the disease gets worse.  · Eat a well-balanced diet.  · Put heat on affected joints when you wake up and before activities. Keep the heat on the affected joint for as long as directed by your health care provider.  · Put ice on affected joints following activities or exercising.    Put ice in a plastic bag.    Place a towel between your skin and the bag.    Leave the ice on for 15-20 minutes, 3-4 times per day, or as directed by your health care provider.  · Take medicines and supplements only as directed by your health care provider.  · Use splints as directed by your health care provider. Splints help maintain joint position and function.  · Do not sleep with pillows under your knees. This may lead to spasms.  · Participate in a self-management program to keep current with the latest treatment and coping skills.  SEEK IMMEDIATE MEDICAL CARE IF:  · You have fainting episodes.  · You have periods of extreme weakness.  · You rapidly develop a hot, painful joint that is more severe than usual joint aches.  · You have chills.  ·   You have a fever.  FOR MORE INFORMATION  · American College of Rheumatology: www.rheumatology.org  · Arthritis Foundation: www.arthritis.org     This information is not intended to replace advice given to you by your health care provider. Make sure you discuss any questions you have with your health care provider.     Document Released: 12/07/2000 Document Revised: 12/31/2014 Document Reviewed: 01/16/2012  Elsevier Interactive Patient Education ©2016 [employer]

## 2016-01-31 LAB — VITAMIN D 25 HYDROXY (VIT D DEFICIENCY, FRACTURES): VIT D 25 HYDROXY: 18 ng/mL — AB (ref 30–100)

## 2016-02-17 ENCOUNTER — Other Ambulatory Visit: Payer: Self-pay | Admitting: Gynecology

## 2016-02-17 DIAGNOSIS — E559 Vitamin D deficiency, unspecified: Secondary | ICD-10-CM

## 2016-02-17 MED ORDER — VITAMIN D (ERGOCALCIFEROL) 1.25 MG (50000 UNIT) PO CAPS: 50000.0000 [IU] | ORAL_CAPSULE | ORAL | Status: DC

## 2016-02-17 NOTE — Progress Notes (Signed)
See previous note

## 2016-02-28 DIAGNOSIS — J209 Acute bronchitis, unspecified: Secondary | ICD-10-CM | POA: Diagnosis not present

## 2016-02-28 DIAGNOSIS — M545 Low back pain: Secondary | ICD-10-CM | POA: Diagnosis not present

## 2016-03-16 DIAGNOSIS — M2012 Hallux valgus (acquired), left foot: Secondary | ICD-10-CM | POA: Insufficient documentation

## 2016-05-03 ENCOUNTER — Other Ambulatory Visit: Payer: Self-pay | Admitting: Internal Medicine

## 2016-05-15 ENCOUNTER — Other Ambulatory Visit: Payer: PPO

## 2016-08-15 DIAGNOSIS — M5430 Sciatica, unspecified side: Secondary | ICD-10-CM | POA: Diagnosis not present

## 2016-09-18 ENCOUNTER — Other Ambulatory Visit: Payer: Self-pay | Admitting: Internal Medicine

## 2016-09-28 DIAGNOSIS — H1133 Conjunctival hemorrhage, bilateral: Secondary | ICD-10-CM | POA: Diagnosis not present

## 2016-10-22 DIAGNOSIS — H2513 Age-related nuclear cataract, bilateral: Secondary | ICD-10-CM | POA: Diagnosis not present

## 2016-11-28 DIAGNOSIS — M25511 Pain in right shoulder: Secondary | ICD-10-CM | POA: Diagnosis not present

## 2016-11-28 DIAGNOSIS — S41101A Unspecified open wound of right upper arm, initial encounter: Secondary | ICD-10-CM | POA: Diagnosis not present

## 2016-11-28 DIAGNOSIS — Z1231 Encounter for screening mammogram for malignant neoplasm of breast: Secondary | ICD-10-CM | POA: Diagnosis not present

## 2016-11-28 DIAGNOSIS — Z23 Encounter for immunization: Secondary | ICD-10-CM | POA: Diagnosis not present

## 2016-11-28 DIAGNOSIS — Z6837 Body mass index (BMI) 37.0-37.9, adult: Secondary | ICD-10-CM | POA: Diagnosis not present

## 2016-12-05 DIAGNOSIS — G5601 Carpal tunnel syndrome, right upper limb: Secondary | ICD-10-CM | POA: Diagnosis not present

## 2016-12-05 DIAGNOSIS — M25511 Pain in right shoulder: Secondary | ICD-10-CM | POA: Diagnosis not present

## 2017-01-30 ENCOUNTER — Ambulatory Visit (INDEPENDENT_AMBULATORY_CARE_PROVIDER_SITE_OTHER): Payer: PPO | Admitting: Gynecology

## 2017-01-30 ENCOUNTER — Other Ambulatory Visit: Payer: Self-pay | Admitting: Gynecology

## 2017-01-30 ENCOUNTER — Encounter: Payer: Self-pay | Admitting: Gynecology

## 2017-01-30 VITALS — BP 120/78 | Ht 59.0 in | Wt 182.0 lb

## 2017-01-30 DIAGNOSIS — R8290 Unspecified abnormal findings in urine: Secondary | ICD-10-CM

## 2017-01-30 DIAGNOSIS — N3000 Acute cystitis without hematuria: Secondary | ICD-10-CM

## 2017-01-30 DIAGNOSIS — Z8639 Personal history of other endocrine, nutritional and metabolic disease: Secondary | ICD-10-CM | POA: Diagnosis not present

## 2017-01-30 DIAGNOSIS — M8588 Other specified disorders of bone density and structure, other site: Secondary | ICD-10-CM

## 2017-01-30 DIAGNOSIS — Z01419 Encounter for gynecological examination (general) (routine) without abnormal findings: Secondary | ICD-10-CM

## 2017-01-30 DIAGNOSIS — F419 Anxiety disorder, unspecified: Secondary | ICD-10-CM | POA: Diagnosis not present

## 2017-01-30 DIAGNOSIS — M8580 Other specified disorders of bone density and structure, unspecified site: Secondary | ICD-10-CM

## 2017-01-30 LAB — URINALYSIS W MICROSCOPIC + REFLEX CULTURE
BILIRUBIN URINE: NEGATIVE
CASTS: NONE SEEN [LPF]
Crystals: NONE SEEN [HPF]
Glucose, UA: NEGATIVE
KETONES UR: NEGATIVE
NITRITE: NEGATIVE
PH: 5.5 (ref 5.0–8.0)
Protein, ur: NEGATIVE
SPECIFIC GRAVITY, URINE: 1.02 (ref 1.001–1.035)
Yeast: NONE SEEN [HPF]

## 2017-01-30 MED ORDER — NITROFURANTOIN MONOHYD MACRO 100 MG PO CAPS: 100.0000 mg | ORAL_CAPSULE | Freq: Two times a day (BID) | ORAL | 0 refills | Status: DC

## 2017-01-30 MED ORDER — LEVOTHYROXINE SODIUM 100 MCG PO TABS: 100.0000 ug | ORAL_TABLET | Freq: Every day | ORAL | 4 refills | Status: DC

## 2017-01-30 MED ORDER — PAROXETINE HCL ER 25 MG PO TB24: 25.0000 mg | ORAL_TABLET | ORAL | 4 refills | Status: DC

## 2017-01-30 NOTE — Patient Instructions (Signed)
Nitrofurantoin tablets or capsules  What is this medicine?  NITROFURANTOIN (nye troe fyoor AN toyn) is an antibiotic. It is used to treat urinary tract infections.  This medicine may be used for other purposes; ask your health care provider or pharmacist if you have questions.  COMMON BRAND NAME(S): Macrobid, Macrodantin, Urotoin  What should I tell my health care provider before I take this medicine?  They need to know if you have any of these conditions:  -anemia  -diabetes  -glucose-6-phosphate dehydrogenase deficiency  -kidney disease  -liver disease  -lung disease  -other chronic illness  -an unusual or allergic reaction to nitrofurantoin, other antibiotics, other medicines, foods, dyes or preservatives  -pregnant or trying to get pregnant  -breast-feeding  How should I use this medicine?  Take this medicine by mouth with a glass of water. Follow the directions on the prescription label. Take this medicine with food or milk. Take your doses at regular intervals. Do not take your medicine more often than directed. Do not stop taking except on your doctor's advice.  Talk to your pediatrician regarding the use of this medicine in children. While this drug may be prescribed for selected conditions, precautions do apply.  Overdosage: If you think you have taken too much of this medicine contact a poison control center or emergency room at once.  NOTE: This medicine is only for you. Do not share this medicine with others.  What if I miss a dose?  If you miss a dose, take it as soon as you can. If it is almost time for your next dose, take only that dose. Do not take double or extra doses.  What may interact with this medicine?  -antacids containing magnesium trisilicate  -probenecid  -quinolone antibiotics like ciprofloxacin, lomefloxacin, norfloxacin and ofloxacin  -sulfinpyrazone  This list may not describe all possible interactions. Give your health care provider a list of all the medicines, herbs,  non-prescription drugs, or dietary supplements you use. Also tell them if you smoke, drink alcohol, or use illegal drugs. Some items may interact with your medicine.  What should I watch for while using this medicine?  Tell your doctor or health care professional if your symptoms do not improve or if you get new symptoms. Drink several glasses of water a day. If you are taking this medicine for a long time, visit your doctor for regular checks on your progress.  If you are diabetic, you may get a false positive result for sugar in your urine with certain brands of urine tests. Check with your doctor.  What side effects may I notice from receiving this medicine?  Side effects that you should report to your doctor or health care professional as soon as possible:  -allergic reactions like skin rash or hives, swelling of the face, lips, or tongue  -chest pain  -cough  -difficulty breathing  -dizziness, drowsiness  -fever or infection  -joint aches or pains  -pale or blue-tinted skin  -redness, blistering, peeling or loosening of the skin, including inside the mouth  -tingling, burning, pain, or numbness in hands or feet  -unusual bleeding or bruising  -unusually weak or tired  -yellowing of eyes or skin  Side effects that usually do not require medical attention (report to your doctor or health care professional if they continue or are bothersome):  -dark urine  -diarrhea  -headache  -loss of appetite  -nausea or vomiting  -temporary hair loss  This list may not describe all possible   medicine after the expiration date. NOTE: This sheet is a summary. It may not cover all possible information. If you have  questions about this medicine, talk to your doctor, pharmacist, or health care provider.  2017 Elsevier/Gold Standard (2008-06-30 15:56:47) Urinary Tract Infection, Adult Introduction A urinary tract infection (UTI) is an infection of any part of the urinary tract. The urinary tract includes the:  Kidneys.  Ureters.  Bladder.  Urethra. These organs make, store, and get rid of pee (urine) in the body. Follow these instructions at home:  Take over-the-counter and prescription medicines only as told by your doctor.  If you were prescribed an antibiotic medicine, take it as told by your doctor. Do not stop taking the antibiotic even if you start to feel better.  Avoid the following drinks:  Alcohol.  Caffeine.  Tea.  Carbonated drinks.  Drink enough fluid to keep your pee clear or pale yellow.  Keep all follow-up visits as told by your doctor. This is important.  Make sure to:  Empty your bladder often and completely. Do not to hold pee for long periods of time.  Empty your bladder before and after sex.  Wipe from front to back after a bowel movement if you are female. Use each tissue one time when you wipe. Contact a doctor if:  You have back pain.  You have a fever.  You feel sick to your stomach (nauseous).  You throw up (vomit).  Your symptoms do not get better after 3 days.  Your symptoms go away and then come back. Get help right away if:  You have very bad back pain.  You have very bad lower belly (abdominal) pain.  You are throwing up and cannot keep down any medicines or water. This information is not intended to replace advice given to you by your health care provider. Make sure you discuss any questions you have with your health care provider. Document Released: 05/28/2008 Document Revised: 05/17/2016 Document Reviewed: 10/31/2015  2017 Elsevier

## 2017-01-30 NOTE — Progress Notes (Signed)
[name] 1946-01-27 [id number]   History:    71 y.o.  for annual gyn exam with the only complaint is of her urine having a foul odor and discomfort during urination and after. Her PCP is Dr. Tomasa BlaseSchultz which she has not followed up with and wanted like to switch primary care physicians.Patient with past history vitamin D deficiency and osteopenia. She also has history of hypothyroidism and suffers from anxiety and depression. She was complaining today of some times upper extremity numbness and achy sensation. Patient in August last year had esophageal dilatation to dysphagia and GERD.  Patient does have history of colon polyps. Her last colonoscopy was in 2016 and she is on a 5 year recall.Patient's last bone density study was in 2016 lowest T score was at the AP spine -2.2. Her right femoral neck -0.9 with a normal Frax analysis. When bone density study compare with 2012 there was statistically significant decrease of bone mineralization on the left femoral neck of -5.8% and AP and right femoral neck no significant change from previous study.  Patient has had history of cystocele and rectocele repair along with sling procedure for stress urinary incontinence and has done well. Patient also has had history of laparoscopic BSO. Patient with no prior history of abnormal Pap smears.   Past medical history,surgical history, family history and social history were all reviewed and documented in the EPIC chart.  Gynecologic History Patient's last menstrual period was 07/18/2007. Contraception: post menopausal status Last Pap: 2013. Results were: normal Last mammogram: 2016. Results were: normal  Obstetric History OB History  Gravida Para Term Preterm AB Living  6 3 3   3 3   SAB TAB Ectopic Multiple Live Births  3       3    # Outcome Date GA Lbr Len/2nd Weight Sex Delivery Anes PTL Lv  6 Term 10/10/83    F Vag-Spont  N LIV  5 Term 11/10/75    M Vag-Spont  N LIV  4 Term 04/04/72    F  Vag-Spont  N LIV  3 SAB           2 SAB           1 SAB                ROS: A ROS was performed and pertinent positives and negatives are included in the history.  GENERAL: No fevers or chills. HEENT: No change in vision, no earache, sore throat or sinus congestion. NECK: No pain or stiffness. CARDIOVASCULAR: No chest pain or pressure. No palpitations. PULMONARY: No shortness of breath, cough or wheeze. GASTROINTESTINAL: No abdominal pain, nausea, vomiting or diarrhea, melena or bright red blood per rectum. GENITOURINARY: No urinary frequency, urgency, hesitancy or dysuria. MUSCULOSKELETAL: No joint or muscle pain, no back pain, no recent trauma. DERMATOLOGIC: No rash, no itching, no lesions. ENDOCRINE: No polyuria, polydipsia, no heat or cold intolerance. No recent change in weight. HEMATOLOGICAL: No anemia or easy bruising or bleeding. NEUROLOGIC: No headache, seizures, numbness, tingling or weakness. PSYCHIATRIC: No depression, no loss of interest in normal activity or change in sleep pattern.     Exam: chaperone present  BP 120/78   Ht 4\' 11"  (1.499 m)   Wt 182 lb (82.6 kg)   LMP 07/18/2007   BMI 36.76 kg/m   Body mass index is 36.76 kg/m.  General appearance : Well developed well nourished female. No acute distress HEENT: Eyes: no retinal hemorrhage or exudates,  Neck supple, trachea midline, no carotid bruits, no thyroidmegaly Lungs: Clear to auscultation, no rhonchi or wheezes, or rib retractions  Heart: Regular rate and rhythm, no murmurs or gallops Breast:Examined in sitting and supine position were symmetrical in appearance, no palpable masses or tenderness,  no skin retraction, no nipple inversion, no nipple discharge, no skin discoloration, no axillary or supraclavicular lymphadenopathy Abdomen: no palpable masses or tenderness, no rebound or guarding Extremities: no edema or skin discoloration or tenderness  Pelvic:  Bartholin, Urethra, Skene Glands: Within normal  limits             Vagina: No gross lesions or discharge, atrophic changes  Cervix: No gross lesions or discharge  Uterus  axial, normal size, shape and consistency, non-tender and mobile  Adnexa  Without masses or tenderness  Anus and perineum  normal   Rectovaginal  normal sphincter tone without palpated masses or tenderness             Hemoccult colonoscopy less than 12 months ago   Urinalysis: 40-60 WBC, 0-20 RBC many bacteria culture pending  Assessment/Plan:  71 y.o. female for annual exam patient will be given a refill for Synthroid as well as her Paxil. I'm going to give her the names of different primary care physicians for her to schedule an appointment with [facility] she needs to be seen by one. Since patient had history vitamin D deficiency in the past we'll going to check a vitamin D level today. Pap smear no longer indicated. She was provided with a requisition to schedule her mammogram. We discussed once again the importance of calcium vitamin D and weightbearing exercises for osteoporosis prevention. Patient is scheduled bone density study at the end of the month. Patient with clinical evidence of urinary tract infection will be prescribed Macrobid one by mouth twice a day for 7 days. An additional 10 minutes was spent discussing urinary tract infection different treatment options and instructions provided as well as prescription.       Ok Edwards MD, 11:31 AM 01/30/2017

## 2017-01-31 ENCOUNTER — Other Ambulatory Visit: Payer: Self-pay | Admitting: Gynecology

## 2017-01-31 ENCOUNTER — Other Ambulatory Visit: Payer: Self-pay

## 2017-01-31 DIAGNOSIS — E559 Vitamin D deficiency, unspecified: Secondary | ICD-10-CM

## 2017-01-31 LAB — VITAMIN D 25 HYDROXY (VIT D DEFICIENCY, FRACTURES): VIT D 25 HYDROXY: 19 ng/mL — AB (ref 30–100)

## 2017-01-31 MED ORDER — VITAMIN D (ERGOCALCIFEROL) 1.25 MG (50000 UNIT) PO CAPS: 50000.0000 [IU] | ORAL_CAPSULE | ORAL | 0 refills | Status: DC

## 2017-02-01 LAB — URINE CULTURE

## 2017-03-16 ENCOUNTER — Other Ambulatory Visit: Payer: Self-pay | Admitting: Internal Medicine

## 2017-03-19 ENCOUNTER — Telehealth: Payer: Self-pay | Admitting: Internal Medicine

## 2017-03-19 NOTE — Telephone Encounter (Signed)
Left voicemail advising patient that she does need office visit before any refills of medications. She was last seen in 2016. I have asked that she call the office to make an appointment.

## 2017-05-08 ENCOUNTER — Encounter: Payer: Self-pay | Admitting: Gynecology

## 2017-05-27 ENCOUNTER — Telehealth: Payer: Self-pay | Admitting: Internal Medicine

## 2017-05-27 NOTE — Telephone Encounter (Signed)
Left voicemail advising patient that we are unable to refill omeprazole as we have not seen her in 2 years (I asked back in March that she schedule a follow up appointment for further refills). Advised that she could get otc omeprazole until her appointment in July.

## 2017-07-09 ENCOUNTER — Encounter: Payer: Self-pay | Admitting: *Deleted

## 2017-07-18 ENCOUNTER — Ambulatory Visit: Payer: Medicare Other | Admitting: Internal Medicine

## 2017-08-27 DIAGNOSIS — Z1231 Encounter for screening mammogram for malignant neoplasm of breast: Secondary | ICD-10-CM | POA: Diagnosis not present

## 2017-08-27 DIAGNOSIS — Z136 Encounter for screening for cardiovascular disorders: Secondary | ICD-10-CM | POA: Diagnosis not present

## 2017-08-27 DIAGNOSIS — E669 Obesity, unspecified: Secondary | ICD-10-CM | POA: Diagnosis not present

## 2017-08-27 DIAGNOSIS — Z9181 History of falling: Secondary | ICD-10-CM | POA: Diagnosis not present

## 2017-08-27 DIAGNOSIS — Z6835 Body mass index (BMI) 35.0-35.9, adult: Secondary | ICD-10-CM | POA: Diagnosis not present

## 2017-08-27 DIAGNOSIS — Z23 Encounter for immunization: Secondary | ICD-10-CM | POA: Diagnosis not present

## 2017-08-27 DIAGNOSIS — N959 Unspecified menopausal and perimenopausal disorder: Secondary | ICD-10-CM | POA: Diagnosis not present

## 2017-08-27 DIAGNOSIS — E785 Hyperlipidemia, unspecified: Secondary | ICD-10-CM | POA: Diagnosis not present

## 2017-08-27 DIAGNOSIS — Z139 Encounter for screening, unspecified: Secondary | ICD-10-CM | POA: Diagnosis not present

## 2017-08-27 DIAGNOSIS — K219 Gastro-esophageal reflux disease without esophagitis: Secondary | ICD-10-CM | POA: Diagnosis not present

## 2017-08-27 DIAGNOSIS — Z1389 Encounter for screening for other disorder: Secondary | ICD-10-CM | POA: Diagnosis not present

## 2017-08-27 DIAGNOSIS — Z0000 Encounter for general adult medical examination without abnormal findings: Secondary | ICD-10-CM | POA: Diagnosis not present

## 2017-11-25 DIAGNOSIS — S61412A Laceration without foreign body of left hand, initial encounter: Secondary | ICD-10-CM | POA: Diagnosis not present

## 2017-12-19 DIAGNOSIS — R251 Tremor, unspecified: Secondary | ICD-10-CM | POA: Diagnosis not present

## 2017-12-19 DIAGNOSIS — R200 Anesthesia of skin: Secondary | ICD-10-CM | POA: Diagnosis not present

## 2017-12-19 DIAGNOSIS — K222 Esophageal obstruction: Secondary | ICD-10-CM | POA: Diagnosis not present

## 2017-12-23 DIAGNOSIS — K219 Gastro-esophageal reflux disease without esophagitis: Secondary | ICD-10-CM | POA: Diagnosis not present

## 2017-12-23 DIAGNOSIS — K222 Esophageal obstruction: Secondary | ICD-10-CM | POA: Diagnosis not present

## 2017-12-23 DIAGNOSIS — R1310 Dysphagia, unspecified: Secondary | ICD-10-CM | POA: Diagnosis not present

## 2018-01-21 DIAGNOSIS — K449 Diaphragmatic hernia without obstruction or gangrene: Secondary | ICD-10-CM | POA: Diagnosis not present

## 2018-01-21 DIAGNOSIS — K222 Esophageal obstruction: Secondary | ICD-10-CM | POA: Diagnosis not present

## 2018-01-21 DIAGNOSIS — R1310 Dysphagia, unspecified: Secondary | ICD-10-CM | POA: Diagnosis not present

## 2018-01-21 DIAGNOSIS — K210 Gastro-esophageal reflux disease with esophagitis: Secondary | ICD-10-CM | POA: Diagnosis not present

## 2018-01-21 DIAGNOSIS — K2210 Ulcer of esophagus without bleeding: Secondary | ICD-10-CM | POA: Diagnosis not present

## 2018-01-21 DIAGNOSIS — Z79899 Other long term (current) drug therapy: Secondary | ICD-10-CM | POA: Diagnosis not present

## 2018-01-21 DIAGNOSIS — K2970 Gastritis, unspecified, without bleeding: Secondary | ICD-10-CM | POA: Diagnosis not present

## 2018-01-21 DIAGNOSIS — E079 Disorder of thyroid, unspecified: Secondary | ICD-10-CM | POA: Diagnosis not present

## 2018-01-29 DIAGNOSIS — G5603 Carpal tunnel syndrome, bilateral upper limbs: Secondary | ICD-10-CM | POA: Diagnosis not present

## 2018-01-29 DIAGNOSIS — M542 Cervicalgia: Secondary | ICD-10-CM | POA: Diagnosis not present

## 2018-01-29 DIAGNOSIS — M79641 Pain in right hand: Secondary | ICD-10-CM | POA: Diagnosis not present

## 2018-01-29 DIAGNOSIS — M5412 Radiculopathy, cervical region: Secondary | ICD-10-CM | POA: Diagnosis not present

## 2018-01-29 DIAGNOSIS — M79642 Pain in left hand: Secondary | ICD-10-CM | POA: Diagnosis not present

## 2018-01-31 ENCOUNTER — Encounter: Payer: Self-pay | Admitting: Obstetrics & Gynecology

## 2018-01-31 ENCOUNTER — Ambulatory Visit: Payer: PPO | Admitting: Obstetrics & Gynecology

## 2018-01-31 VITALS — BP 132/90 | Ht 59.5 in | Wt 180.0 lb

## 2018-01-31 DIAGNOSIS — Z780 Asymptomatic menopausal state: Secondary | ICD-10-CM

## 2018-01-31 DIAGNOSIS — Z01419 Encounter for gynecological examination (general) (routine) without abnormal findings: Secondary | ICD-10-CM | POA: Diagnosis not present

## 2018-01-31 DIAGNOSIS — F419 Anxiety disorder, unspecified: Secondary | ICD-10-CM

## 2018-01-31 DIAGNOSIS — Z13820 Encounter for screening for osteoporosis: Secondary | ICD-10-CM

## 2018-01-31 DIAGNOSIS — E039 Hypothyroidism, unspecified: Secondary | ICD-10-CM

## 2018-01-31 MED ORDER — LEVOTHYROXINE SODIUM 100 MCG PO TABS: 100.0000 ug | ORAL_TABLET | Freq: Every day | ORAL | 4 refills | Status: DC

## 2018-01-31 MED ORDER — PAROXETINE HCL ER 25 MG PO TB24: 25.0000 mg | ORAL_TABLET | ORAL | 4 refills | Status: DC

## 2018-01-31 NOTE — Progress Notes (Signed)
[name] 18-Feb-1946 [id number]   History:    72 y.o. [id number]  RP:  Established patient presenting for annual gyn exam   HPI: Menopause, well on no HRT.  No PMB.  No pelvic pain.  Normal vaginal secretions.  Abstinent.  Breasts normal.  Urine and bowel movements normal.  Anxiety and mild depressive symptoms controlled on Paxil.  Hypothyroidism on Synthroid.  Health labs with family physician.  Past medical history,surgical history, family history and social history were all reviewed and documented in the EPIC chart.  Gynecologic History Patient's last menstrual period was 07/18/2007. Contraception: post menopausal status Last Pap: 2013. Results were: normal Last mammogram: 02/2015. Results were: Dx Mammo benign Bone Density: Osteopenia T-Score -2.2 at Spine 01/2015 Colonoscopy: 2015, benign polyps.  5-year schedule.  Obstetric History OB History  Gravida Para Term Preterm AB Living  6 3 3   3 3   SAB TAB Ectopic Multiple Live Births  3       3    # Outcome Date GA Lbr Len/2nd Weight Sex Delivery Anes PTL Lv  6 Term 10/10/83    F Vag-Spont  N LIV  5 Term 11/10/75    M Vag-Spont  N LIV  4 Term 04/04/72    F Vag-Spont  N LIV  3 SAB           2 SAB           1 SAB                ROS: A ROS was performed and pertinent positives and negatives are included in the history.  GENERAL: No fevers or chills. HEENT: No change in vision, no earache, sore throat or sinus congestion. NECK: No pain or stiffness. CARDIOVASCULAR: No chest pain or pressure. No palpitations. PULMONARY: No shortness of breath, cough or wheeze. GASTROINTESTINAL: No abdominal pain, nausea, vomiting or diarrhea, melena or bright red blood per rectum. GENITOURINARY: No urinary frequency, urgency, hesitancy or dysuria. MUSCULOSKELETAL: No joint or muscle pain, no back pain, no recent trauma. DERMATOLOGIC: No rash, no itching, no lesions. ENDOCRINE: No polyuria, polydipsia, no heat or cold intolerance. No recent  change in weight. HEMATOLOGICAL: No anemia or easy bruising or bleeding. NEUROLOGIC: No headache, seizures, numbness, tingling or weakness. PSYCHIATRIC: No depression, no loss of interest in normal activity or change in sleep pattern.     Exam:   BP 132/90   Ht 4' 11.5" (1.511 m)   Wt 180 lb (81.6 kg)   LMP 07/18/2007   BMI 35.75 kg/m   Body mass index is 35.75 kg/m.  General appearance : Well developed well nourished female. No acute distress HEENT: Eyes: no retinal hemorrhage or exudates,  Neck supple, trachea midline, no carotid bruits, no thyroidmegaly Lungs: Clear to auscultation, no rhonchi or wheezes, or rib retractions  Heart: Regular rate and rhythm, no murmurs or gallops Breast:Examined in sitting and supine position were symmetrical in appearance, no palpable masses or tenderness,  no skin retraction, no nipple inversion, no nipple discharge, no skin discoloration, no axillary or supraclavicular lymphadenopathy Abdomen: no palpable masses or tenderness, no rebound or guarding Extremities: no edema or skin discoloration or tenderness  Pelvic: Vulva: Normal             Vagina: No gross lesions or discharge  Cervix: No gross lesions or discharge.  Pap reflex done.  Uterus  AV, normal size, shape and consistency, non-tender and mobile  Adnexa  Without masses or tenderness  Anus: Normal   Assessment/Plan:  72 y.o. female for annual exam   1. Encounter for routine gynecological examination with Papanicolaou smear of cervix Normal gynecologic exam.  Pap reflex done.  Breast exam normal.  Will schedule screening mammogram.  Health labs with family physician.  Next colonoscopy in 2020.  2. Menopause present Well on no hormone replacement therapy.  3. Screening for osteoporosis Vitamin D supplements, calcium rich nutrition and regular weightbearing physical activity recommended.  Follow-up here for bone density. - DG Bone Density; Future  4. Anxiety Controlled on Paxil.   Treatment represcribed. - PARoxetine (PAXIL-CR) 25 MG 24 hr tablet; Take 1 tablet (25 mg total) by mouth every morning.  5. Acquired hypothyroidism Synthroid represcribed.  Patient will follow up with family physician for health labs including a TSH. - levothyroxine (SYNTHROID, LEVOTHROID) 100 MCG tablet; Take 1 tablet (100 mcg total) by mouth daily.  Counseling on above issues more than 50% for 15 minutes.  Genia DelMarie-Lyne Tymesha Ditmore MD, 11:23 AM 01/31/2018

## 2018-02-03 ENCOUNTER — Encounter: Payer: Self-pay | Admitting: Obstetrics & Gynecology

## 2018-02-03 NOTE — Patient Instructions (Signed)
1. Encounter for routine gynecological examination with Papanicolaou smear of cervix Normal gynecologic exam.  Pap reflex done.  Breast exam normal.  Will schedule screening mammogram.  Health labs with family physician.  Next colonoscopy in 2020.  2. Menopause present Well on no hormone replacement therapy.  3. Screening for osteoporosis Vitamin D supplements, calcium rich nutrition and regular weightbearing physical activity recommended.  Follow-up here for bone density. - DG Bone Density; Future  4. Anxiety Controlled on Paxil.  Treatment represcribed. - PARoxetine (PAXIL-CR) 25 MG 24 hr tablet; Take 1 tablet (25 mg total) by mouth every morning.  5. Acquired hypothyroidism Synthroid represcribed.  Patient will follow up with family physician for health labs including a TSH. - levothyroxine (SYNTHROID, LEVOTHROID) 100 MCG tablet; Take 1 tablet (100 mcg total) by mouth daily.  [name], it was a pleasure meeting you today!  I will inform you of your results as soon as they are available.   Health Maintenance for Postmenopausal Women Menopause is a normal process in which your reproductive ability comes to an end. This process happens gradually over a span of months to years, usually between the ages of 52 and 67. Menopause is complete when you have missed 12 consecutive menstrual periods. It is important to talk with your health care provider about some of the most common conditions that affect postmenopausal women, such as heart disease, cancer, and bone loss (osteoporosis). Adopting a healthy lifestyle and getting preventive care can help to promote your health and wellness. Those actions can also lower your chances of developing some of these common conditions. What should I know about menopause? During menopause, you may experience a number of symptoms, such as:  Moderate-to-severe hot flashes.  Night sweats.  Decrease in sex drive.  Mood  swings.  Headaches.  Tiredness.  Irritability.  Memory problems.  Insomnia.  Choosing to treat or not to treat menopausal changes is an individual decision that you make with your health care provider. What should I know about hormone replacement therapy and supplements? Hormone therapy products are effective for treating symptoms that are associated with menopause, such as hot flashes and night sweats. Hormone replacement carries certain risks, especially as you become older. If you are thinking about using estrogen or estrogen with progestin treatments, discuss the benefits and risks with your health care provider. What should I know about heart disease and stroke? Heart disease, heart attack, and stroke become more likely as you age. This may be due, in part, to the hormonal changes that your body experiences during menopause. These can affect how your body processes dietary fats, triglycerides, and cholesterol. Heart attack and stroke are both medical emergencies. There are many things that you can do to help prevent heart disease and stroke:  Have your blood pressure checked at least every 1-2 years. High blood pressure causes heart disease and increases the risk of stroke.  If you are 3-26 years old, ask your health care provider if you should take aspirin to prevent a heart attack or a stroke.  Do not use any tobacco products, including cigarettes, chewing tobacco, or electronic cigarettes. If you need help quitting, ask your health care provider.  It is important to eat a healthy diet and maintain a healthy weight. ? Be sure to include plenty of vegetables, fruits, low-fat dairy products, and lean protein. ? Avoid eating foods that are high in solid fats, added sugars, or salt (sodium).  Get regular exercise. This is one of the most important  things that you can do for your health. ? Try to exercise for at least 150 minutes each week. The type of exercise that you do should  increase your heart rate and make you sweat. This is known as moderate-intensity exercise. ? Try to do strengthening exercises at least twice each week. Do these in addition to the moderate-intensity exercise.  Know your numbers.Ask your health care provider to check your cholesterol and your blood glucose. Continue to have your blood tested as directed by your health care provider.  What should I know about cancer screening? There are several types of cancer. Take the following steps to reduce your risk and to catch any cancer development as early as possible. Breast Cancer  Practice breast self-awareness. ? This means understanding how your breasts normally appear and feel. ? It also means doing regular breast self-exams. Let your health care provider know about any changes, no matter how small.  If you are 65 or older, have a clinician do a breast exam (clinical breast exam or CBE) every year. Depending on your age, family history, and medical history, it may be recommended that you also have a yearly breast X-ray (mammogram).  If you have a family history of breast cancer, talk with your health care provider about genetic screening.  If you are at high risk for breast cancer, talk with your health care provider about having an MRI and a mammogram every year.  Breast cancer (BRCA) gene test is recommended for women who have family members with BRCA-related cancers. Results of the assessment will determine the need for genetic counseling and BRCA1 and for BRCA2 testing. BRCA-related cancers include these types: ? Breast. This occurs in males or females. ? Ovarian. ? Tubal. This may also be called fallopian tube cancer. ? Cancer of the abdominal or pelvic lining (peritoneal cancer). ? Prostate. ? Pancreatic.  Cervical, Uterine, and Ovarian Cancer Your health care provider may recommend that you be screened regularly for cancer of the pelvic organs. These include your ovaries, uterus,  and vagina. This screening involves a pelvic exam, which includes checking for microscopic changes to the surface of your cervix (Pap test).  For women ages 21-65, health care providers may recommend a pelvic exam and a Pap test every three years. For women ages 47-65, they may recommend the Pap test and pelvic exam, combined with testing for human papilloma virus (HPV), every five years. Some types of HPV increase your risk of cervical cancer. Testing for HPV may also be done on women of any age who have unclear Pap test results.  Other health care providers may not recommend any screening for nonpregnant women who are considered low risk for pelvic cancer and have no symptoms. Ask your health care provider if a screening pelvic exam is right for you.  If you have had past treatment for cervical cancer or a condition that could lead to cancer, you need Pap tests and screening for cancer for at least 20 years after your treatment. If Pap tests have been discontinued for you, your risk factors (such as having a new sexual partner) need to be reassessed to determine if you should start having screenings again. Some women have medical problems that increase the chance of getting cervical cancer. In these cases, your health care provider may recommend that you have screening and Pap tests more often.  If you have a family history of uterine cancer or ovarian cancer, talk with your health care provider about genetic screening.  If you have vaginal bleeding after reaching menopause, tell your health care provider.  There are currently no reliable tests available to screen for ovarian cancer.  Lung Cancer Lung cancer screening is recommended for adults 41-28 years old who are at high risk for lung cancer because of a history of smoking. A yearly low-dose CT scan of the lungs is recommended if you:  Currently smoke.  Have a history of at least 30 pack-years of smoking and you currently smoke or have quit  within the past 15 years. A pack-year is smoking an average of one pack of cigarettes per day for one year.  Yearly screening should:  Continue until it has been 15 years since you quit.  Stop if you develop a health problem that would prevent you from having lung cancer treatment.  Colorectal Cancer  This type of cancer can be detected and can often be prevented.  Routine colorectal cancer screening usually begins at age 100 and continues through age 90.  If you have risk factors for colon cancer, your health care provider may recommend that you be screened at an earlier age.  If you have a family history of colorectal cancer, talk with your health care provider about genetic screening.  Your health care provider may also recommend using home test kits to check for hidden blood in your stool.  A small camera at the end of a tube can be used to examine your colon directly (sigmoidoscopy or colonoscopy). This is done to check for the earliest forms of colorectal cancer.  Direct examination of the colon should be repeated every 5-10 years until age 23. However, if early forms of precancerous polyps or small growths are found or if you have a family history or genetic risk for colorectal cancer, you may need to be screened more often.  Skin Cancer  Check your skin from head to toe regularly.  Monitor any moles. Be sure to tell your health care provider: ? About any new moles or changes in moles, especially if there is a change in a mole's shape or color. ? If you have a mole that is larger than the size of a pencil eraser.  If any of your family members has a history of skin cancer, especially at a young age, talk with your health care provider about genetic screening.  Always use sunscreen. Apply sunscreen liberally and repeatedly throughout the day.  Whenever you are outside, protect yourself by wearing long sleeves, pants, a wide-brimmed hat, and sunglasses.  What should I know  about osteoporosis? Osteoporosis is a condition in which bone destruction happens more quickly than new bone creation. After menopause, you may be at an increased risk for osteoporosis. To help prevent osteoporosis or the bone fractures that can happen because of osteoporosis, the following is recommended:  If you are 37-16 years old, get at least 1,000 mg of calcium and at least 600 mg of vitamin D per day.  If you are older than age 60 but younger than age 45, get at least 1,200 mg of calcium and at least 600 mg of vitamin D per day.  If you are older than age 3, get at least 1,200 mg of calcium and at least 800 mg of vitamin D per day.  Smoking and excessive alcohol intake increase the risk of osteoporosis. Eat foods that are rich in calcium and vitamin D, and do weight-bearing exercises several times each week as directed by your health care provider. What should  I know about how menopause affects my mental health? Depression may occur at any age, but it is more common as you become older. Common symptoms of depression include:  Low or sad mood.  Changes in sleep patterns.  Changes in appetite or eating patterns.  Feeling an overall lack of motivation or enjoyment of activities that you previously enjoyed.  Frequent crying spells.  Talk with your health care provider if you think that you are experiencing depression. What should I know about immunizations? It is important that you get and maintain your immunizations. These include:  Tetanus, diphtheria, and pertussis (Tdap) booster vaccine.  Influenza every year before the flu season begins.  Pneumonia vaccine.  Shingles vaccine.  Your health care provider may also recommend other immunizations. This information is not intended to replace advice given to you by your health care provider. Make sure you discuss any questions you have with your health care provider. Document Released: 02/01/2006 Document Revised: 06/29/2016  Document Reviewed: 09/13/2015 Elsevier Interactive Patient Education  2018 [employer].

## 2018-02-04 ENCOUNTER — Other Ambulatory Visit: Payer: Self-pay | Admitting: Orthopedic Surgery

## 2018-02-04 DIAGNOSIS — G5603 Carpal tunnel syndrome, bilateral upper limbs: Secondary | ICD-10-CM | POA: Diagnosis not present

## 2018-02-04 DIAGNOSIS — M5412 Radiculopathy, cervical region: Secondary | ICD-10-CM | POA: Diagnosis not present

## 2018-02-05 ENCOUNTER — Other Ambulatory Visit: Payer: Self-pay

## 2018-02-05 ENCOUNTER — Encounter (HOSPITAL_BASED_OUTPATIENT_CLINIC_OR_DEPARTMENT_OTHER): Payer: Self-pay | Admitting: *Deleted

## 2018-02-06 LAB — PAP IG W/ RFLX HPV ASCU

## 2018-02-10 ENCOUNTER — Ambulatory Visit (HOSPITAL_BASED_OUTPATIENT_CLINIC_OR_DEPARTMENT_OTHER): Payer: PPO | Admitting: Certified Registered"

## 2018-02-10 ENCOUNTER — Other Ambulatory Visit: Payer: Self-pay

## 2018-02-10 ENCOUNTER — Encounter (HOSPITAL_BASED_OUTPATIENT_CLINIC_OR_DEPARTMENT_OTHER)
Admission: RE | Disposition: A | Discharge status: Home / Self Care | Payer: Self-pay | Source: Ambulatory Visit | Attending: Orthopedic Surgery

## 2018-02-10 ENCOUNTER — Encounter (HOSPITAL_BASED_OUTPATIENT_CLINIC_OR_DEPARTMENT_OTHER): Payer: Self-pay | Admitting: Certified Registered"

## 2018-02-10 ENCOUNTER — Ambulatory Visit (HOSPITAL_BASED_OUTPATIENT_CLINIC_OR_DEPARTMENT_OTHER)
Admission: RE | Admit: 2018-02-10 | Discharge: 2018-02-10 | Disposition: A | Discharge status: Home / Self Care | Payer: PPO | Source: Ambulatory Visit | Attending: Orthopedic Surgery | Admitting: Orthopedic Surgery

## 2018-02-10 DIAGNOSIS — K5730 Diverticulosis of large intestine without perforation or abscess without bleeding: Secondary | ICD-10-CM | POA: Diagnosis not present

## 2018-02-10 DIAGNOSIS — E039 Hypothyroidism, unspecified: Secondary | ICD-10-CM | POA: Diagnosis not present

## 2018-02-10 DIAGNOSIS — Z884 Allergy status to anesthetic agent status: Secondary | ICD-10-CM | POA: Diagnosis not present

## 2018-02-10 DIAGNOSIS — F329 Major depressive disorder, single episode, unspecified: Secondary | ICD-10-CM | POA: Diagnosis not present

## 2018-02-10 DIAGNOSIS — Z8249 Family history of ischemic heart disease and other diseases of the circulatory system: Secondary | ICD-10-CM | POA: Diagnosis not present

## 2018-02-10 DIAGNOSIS — Z96651 Presence of right artificial knee joint: Secondary | ICD-10-CM | POA: Diagnosis not present

## 2018-02-10 DIAGNOSIS — M8580 Other specified disorders of bone density and structure, unspecified site: Secondary | ICD-10-CM | POA: Insufficient documentation

## 2018-02-10 DIAGNOSIS — E559 Vitamin D deficiency, unspecified: Secondary | ICD-10-CM | POA: Diagnosis not present

## 2018-02-10 DIAGNOSIS — Z888 Allergy status to other drugs, medicaments and biological substances status: Secondary | ICD-10-CM | POA: Diagnosis not present

## 2018-02-10 DIAGNOSIS — Z886 Allergy status to analgesic agent status: Secondary | ICD-10-CM | POA: Insufficient documentation

## 2018-02-10 DIAGNOSIS — K219 Gastro-esophageal reflux disease without esophagitis: Secondary | ICD-10-CM | POA: Diagnosis not present

## 2018-02-10 DIAGNOSIS — F419 Anxiety disorder, unspecified: Secondary | ICD-10-CM | POA: Diagnosis not present

## 2018-02-10 DIAGNOSIS — G5601 Carpal tunnel syndrome, right upper limb: Secondary | ICD-10-CM | POA: Diagnosis not present

## 2018-02-10 DIAGNOSIS — Z86010 Personal history of colonic polyps: Secondary | ICD-10-CM | POA: Diagnosis not present

## 2018-02-10 DIAGNOSIS — Z79899 Other long term (current) drug therapy: Secondary | ICD-10-CM | POA: Insufficient documentation

## 2018-02-10 DIAGNOSIS — G4700 Insomnia, unspecified: Secondary | ICD-10-CM | POA: Insufficient documentation

## 2018-02-10 HISTORY — PX: CARPAL TUNNEL RELEASE: SHX101

## 2018-02-10 HISTORY — DX: Gastro-esophageal reflux disease without esophagitis: K21.9

## 2018-02-10 SURGERY — CARPAL TUNNEL RELEASE
Anesthesia: General | Site: Wrist | Laterality: Right

## 2018-02-10 MED ORDER — FENTANYL CITRATE (PF) 100 MCG/2ML IJ SOLN
25.0000 ug | INTRAMUSCULAR | Status: DC | PRN
  Administered 2018-02-10: 50 ug via INTRAVENOUS
  Administered 2018-02-10: 25 ug via INTRAVENOUS

## 2018-02-10 MED ORDER — FENTANYL CITRATE (PF) 100 MCG/2ML IJ SOLN
INTRAMUSCULAR | Status: AC
  Filled 2018-02-10: qty 2

## 2018-02-10 MED ORDER — MIDAZOLAM HCL 2 MG/2ML IJ SOLN
INTRAMUSCULAR | Status: AC
  Filled 2018-02-10: qty 2

## 2018-02-10 MED ORDER — MIDAZOLAM HCL 2 MG/2ML IJ SOLN: 1.0000 mg | INTRAMUSCULAR | Status: DC | PRN

## 2018-02-10 MED ORDER — PROPOFOL 10 MG/ML IV BOLUS
INTRAVENOUS | Status: DC | PRN
  Administered 2018-02-10: 150 mg via INTRAVENOUS

## 2018-02-10 MED ORDER — ONDANSETRON HCL 4 MG/2ML IJ SOLN
INTRAMUSCULAR | Status: AC
  Filled 2018-02-10: qty 2

## 2018-02-10 MED ORDER — CEFAZOLIN SODIUM-DEXTROSE 2-4 GM/100ML-% IV SOLN
2.0000 g | INTRAVENOUS | Status: AC
  Administered 2018-02-10: 2 g via INTRAVENOUS

## 2018-02-10 MED ORDER — CHLORHEXIDINE GLUCONATE 4 % EX LIQD: 60.0000 mL | Freq: Once | CUTANEOUS | Status: DC

## 2018-02-10 MED ORDER — HYDROCODONE-ACETAMINOPHEN 5-325 MG PO TABS: ORAL_TABLET | ORAL | 0 refills | Status: DC

## 2018-02-10 MED ORDER — OXYCODONE HCL 5 MG PO TABS: 5.0000 mg | ORAL_TABLET | Freq: Once | ORAL | Status: DC | PRN

## 2018-02-10 MED ORDER — CEFAZOLIN SODIUM-DEXTROSE 2-4 GM/100ML-% IV SOLN
INTRAVENOUS | Status: AC
  Filled 2018-02-10: qty 100

## 2018-02-10 MED ORDER — LACTATED RINGERS IV SOLN
INTRAVENOUS | Status: DC
  Administered 2018-02-10 (×2): via INTRAVENOUS

## 2018-02-10 MED ORDER — DEXAMETHASONE SODIUM PHOSPHATE 4 MG/ML IJ SOLN
INTRAMUSCULAR | Status: DC | PRN
  Administered 2018-02-10: 10 mg via INTRAVENOUS

## 2018-02-10 MED ORDER — LIDOCAINE 2% (20 MG/ML) 5 ML SYRINGE
INTRAMUSCULAR | Status: AC
  Filled 2018-02-10: qty 5

## 2018-02-10 MED ORDER — OXYCODONE HCL 5 MG/5ML PO SOLN: 5.0000 mg | Freq: Once | ORAL | Status: DC | PRN

## 2018-02-10 MED ORDER — FENTANYL CITRATE (PF) 100 MCG/2ML IJ SOLN: 50.0000 ug | INTRAMUSCULAR | Status: DC | PRN

## 2018-02-10 MED ORDER — FENTANYL CITRATE (PF) 100 MCG/2ML IJ SOLN
INTRAMUSCULAR | Status: DC | PRN
  Administered 2018-02-10: 100 ug via INTRAVENOUS

## 2018-02-10 MED ORDER — ACETAMINOPHEN 325 MG PO TABS: 325.0000 mg | ORAL_TABLET | ORAL | Status: DC | PRN

## 2018-02-10 MED ORDER — MIDAZOLAM HCL 5 MG/5ML IJ SOLN
INTRAMUSCULAR | Status: DC | PRN
  Administered 2018-02-10: 2 mg via INTRAVENOUS

## 2018-02-10 MED ORDER — GLYCOPYRROLATE 0.2 MG/ML IJ SOLN
INTRAMUSCULAR | Status: DC | PRN
  Administered 2018-02-10: 0.1 mg via INTRAVENOUS

## 2018-02-10 MED ORDER — ACETAMINOPHEN 160 MG/5ML PO SOLN: 325.0000 mg | ORAL | Status: DC | PRN

## 2018-02-10 MED ORDER — PHENYLEPHRINE 40 MCG/ML (10ML) SYRINGE FOR IV PUSH (FOR BLOOD PRESSURE SUPPORT)
PREFILLED_SYRINGE | INTRAVENOUS | Status: AC
  Filled 2018-02-10: qty 10

## 2018-02-10 MED ORDER — SCOPOLAMINE 1 MG/3DAYS TD PT72: 1.0000 | MEDICATED_PATCH | Freq: Once | TRANSDERMAL | Status: DC | PRN

## 2018-02-10 SURGICAL SUPPLY — 35 items
BANDAGE ACE 3X5.8 VEL STRL LF (GAUZE/BANDAGES/DRESSINGS) ×3 IMPLANT
BLADE SURG 15 STRL LF DISP TIS (BLADE) ×2 IMPLANT
BLADE SURG 15 STRL SS (BLADE) ×4
BNDG ESMARK 4X9 LF (GAUZE/BANDAGES/DRESSINGS) ×3 IMPLANT
BNDG GAUZE ELAST 4 BULKY (GAUZE/BANDAGES/DRESSINGS) ×3 IMPLANT
CHLORAPREP W/TINT 26ML (MISCELLANEOUS) ×3 IMPLANT
CORD BIPOLAR FORCEPS 12FT (ELECTRODE) ×3 IMPLANT
COVER BACK TABLE 60X90IN (DRAPES) ×3 IMPLANT
COVER MAYO STAND STRL (DRAPES) ×3 IMPLANT
CUFF TOURNIQUET SINGLE 18IN (TOURNIQUET CUFF) ×3 IMPLANT
DRAPE EXTREMITY T 121X128X90 (DRAPE) ×3 IMPLANT
DRAPE SURG 17X23 STRL (DRAPES) ×3 IMPLANT
DRSG PAD ABDOMINAL 8X10 ST (GAUZE/BANDAGES/DRESSINGS) ×3 IMPLANT
GAUZE SPONGE 4X4 12PLY STRL (GAUZE/BANDAGES/DRESSINGS) ×3 IMPLANT
GAUZE XEROFORM 1X8 LF (GAUZE/BANDAGES/DRESSINGS) ×3 IMPLANT
GLOVE BIO SURGEON STRL SZ7.5 (GLOVE) ×3 IMPLANT
GLOVE BIOGEL PI IND STRL 7.0 (GLOVE) ×1 IMPLANT
GLOVE BIOGEL PI IND STRL 8 (GLOVE) ×1 IMPLANT
GLOVE BIOGEL PI INDICATOR 7.0 (GLOVE) ×2
GLOVE BIOGEL PI INDICATOR 8 (GLOVE) ×2
GLOVE ECLIPSE 6.5 STRL STRAW (GLOVE) ×3 IMPLANT
GOWN STRL REUS W/ TWL LRG LVL3 (GOWN DISPOSABLE) ×1 IMPLANT
GOWN STRL REUS W/TWL LRG LVL3 (GOWN DISPOSABLE) ×2
GOWN STRL REUS W/TWL XL LVL3 (GOWN DISPOSABLE) ×3 IMPLANT
NEEDLE HYPO 25X1 1.5 SAFETY (NEEDLE) ×3 IMPLANT
NS IRRIG 1000ML POUR BTL (IV SOLUTION) ×3 IMPLANT
PACK BASIN DAY SURGERY FS (CUSTOM PROCEDURE TRAY) ×3 IMPLANT
PADDING CAST ABS 4INX4YD NS (CAST SUPPLIES) ×2
PADDING CAST ABS COTTON 4X4 ST (CAST SUPPLIES) ×1 IMPLANT
STOCKINETTE 4X48 STRL (DRAPES) ×3 IMPLANT
SUT ETHILON 4 0 PS 2 18 (SUTURE) ×3 IMPLANT
SYR BULB 3OZ (MISCELLANEOUS) ×3 IMPLANT
SYR CONTROL 10ML LL (SYRINGE) ×3 IMPLANT
TOWEL OR 17X24 6PK STRL BLUE (TOWEL DISPOSABLE) ×6 IMPLANT
UNDERPAD 30X30 (UNDERPADS AND DIAPERS) ×3 IMPLANT

## 2018-02-10 NOTE — Discharge Instructions (Addendum)

## 2018-02-10 NOTE — Anesthesia Procedure Notes (Signed)
Procedure Name: LMA Insertion Date/Time: 02/10/2018 2:19 PM Performed by:  DesanctisLinka, Atilla Zollner L, CRNA Pre-anesthesia Checklist: Patient identified, Emergency Drugs available, Suction available, Patient being monitored and Timeout performed Patient Re-evaluated:Patient Re-evaluated prior to induction Oxygen Delivery Method: Circle system utilized Preoxygenation: Pre-oxygenation with 100% oxygen Induction Type: IV induction Ventilation: Mask ventilation without difficulty LMA: LMA inserted LMA Size: 4.0 Number of attempts: 1 Airway Equipment and Method: Bite block Placement Confirmation: positive ETCO2 Tube secured with: Tape Dental Injury: Teeth and Oropharynx as per pre-operative assessment

## 2018-02-10 NOTE — H&P (Signed)
  Cheryl Alvis LemmingsDawn Dorethea Whitaker is an 72 y.o. female.   Chief Complaint: right carpal tunnel syndrome HPI: 72 yo female with numbness and tingling right hand.  This is bothersome to her.  Positive nerve conduction studies.  She wishes to have a carpal tunnel release.  Allergies:  Allergies  Allergen Reactions  . Naproxen Anaphylaxis    SUICIDAL THOUGHTS  . Ether   . Lidocaine   . Lidocaine Hcl     Past Medical History:  Diagnosis Date  . Anxiety   . Colon polyp 2003  . Depression   . GERD (gastroesophageal reflux disease)   . Hypothyroid   . Insomnia   . Osteopenia   . Renal abscess    PAST HISTORY OF LEFT RENAL ABSCESS  . Schatzki's ring   . Vitamin D deficiency 12/2009    Past Surgical History:  Procedure Laterality Date  . ANTERIOR AND POSTERIOR REPAIR  2001   WITH SLING  . BLADDER SUSPENSION  2001   AT TIME OF A/P REPAIR  . COLONOSCOPY  march 2015  . OOPHORECTOMY  11/26/2007   LAP BSO  . TOTAL KNEE ARTHROPLASTY     RIGHT    Family History: Family History  Problem Relation Age of Onset  . Cancer Mother        LUNG CA-SMOKER-DIED AT 10561 WITH IT  . Cancer Father        MALIGNANT MELANOMA--DIED AT AGE 42 WITH IT  . Other Sister        legendarie's  . Cirrhosis Brother        alcholic  . Cancer Paternal Aunt        ovarian   . Heart disease Maternal Grandmother   . Hypertension Maternal Grandmother     Social History:   reports that  has never smoked. she has never used smokeless tobacco. She reports that she does not drink alcohol or use drugs.  Medications: No medications prior to admission.    No results found for this or any previous visit (from the past 48 hour(s)).  No results found.   A comprehensive review of systems was negative.  Height 4' 11.5" (1.511 m), weight 81.6 kg (180 lb), last menstrual period 07/18/2007.  General appearance: alert, cooperative and appears stated age Head: Normocephalic, without obvious abnormality, atraumatic Neck:  supple, symmetrical, trachea midline Cardio: regular rate and rhythm Resp: clear to auscultation bilaterally Extremities: Intact sensation and capillary refill all digits.  +epl/fpl/io.  No wounds.  Pulses: 2+ and symmetric Skin: Skin color, texture, turgor normal. No rashes or lesions Neurologic: Grossly normal Incision/Wound:none  Assessment/Plan Right carpal tunnel syndrome.  Non operative and operative treatment options were discussed with the patient and patient wishes to proceed with operative treatment. Risks, benefits, and alternatives of surgery were discussed and the patient agrees with the plan of care.   Mira Balon R 02/10/2018, 12:11 PM

## 2018-02-10 NOTE — Op Note (Addendum)
02/10/2018 [facility]                              OPERATIVE REPORT   PREOPERATIVE DIAGNOSIS:  Right carpal tunnel syndrome.  POSTOPERATIVE DIAGNOSIS:  Right carpal tunnel syndrome.  PROCEDURE:  Right carpal tunnel release.  SURGEON:  Betha Loa, MD  ASSISTANT:  none.  ANESTHESIA:  General.  IV FLUIDS:  Per anesthesia flow sheet.  ESTIMATED BLOOD LOSS:  Minimal.  COMPLICATIONS:  None.  SPECIMENS:  None.  TOURNIQUET TIME:    Total Tourniquet Time Documented: Forearm (Right) - 15 minutes Total: Forearm (Right) - 15 minutes   DISPOSITION:  Stable to PACU.  LOCATION: [facility]  INDICATIONS:  72 yo female with numbness and tingling right hand.  Positive nerve conduction studies.   She wishes to have a carpal tunnel release for management of her symptoms.  Risks, benefits and alternatives of surgery were discussed including the risk of blood loss; infection; damage to nerves, vessels, tendons, ligaments, bone; failure of surgery; need for additional surgery; complications with wound healing; continued pain; recurrence of carpal tunnel syndrome; and damage to motor branch. She voiced understanding of these risks and elected to proceed.   OPERATIVE COURSE:  After being identified preoperatively by myself, the patient and I agreed upon the procedure and site of procedure.  The surgical site was marked.  The risks, benefits, and alternatives of the surgery were reviewed and she wished to proceed.  Surgical consent had been signed.  She was given IV Ancef as preoperative antibiotic prophylaxis.  She was transferred to the operating room and placed on the operating room table in supine position with the Right upper extremity on an armboard.  General was induced by Anesthesiology.  Right upper extremity was prepped and draped in normal sterile orthopaedic fashion.  A surgical pause was performed between the surgeons, anesthesia, and operating room staff, and  all were in agreement as to the patient, procedure, and site of procedure.  Tourniquet at the proximal aspect of the arm was inflated after exsanguination of the limb with an Esmarch bandage.   Incision was made over the transverse carpal ligament and carried into the subcutaneous tissues by spreading technique.  Bipolar electrocautery was used to obtain hemostasis.  The palmar fascia was sharply incised.  The transverse carpal ligament was identified and sharply incised.  It was incised distally first.  Care was taken to ensure complete decompression distally.  It was then incised proximally.  Scissors were used to split the distal aspect of the volar antebrachial fascia.  A finger was placed into the wound to ensure complete decompression, which was the case.  The nerve was examined.  There was an hourglass deformity.  The motor branch was identified and was intact.  The wound was copiously irrigated with sterile saline.  It was then closed with 4-0 nylon in a horizontal mattress fashion. No marcaine was injected due to allergy profile including lidocaine.  It was dressed with sterile Xeroform, 4x4s, an ABD, and wrapped with Kerlix and an Ace bandage.  Tourniquet was deflated at 15 minutes.  Fingertips were pink with brisk capillary refill after deflation of the tourniquet.  Operative drapes were broken down.  The patient was awoken from anesthesia safely.  She was transferred back to stretcher and taken to the PACU in stable condition.  I will see her back in the office in 1 week for  postoperative followup.  I will give her a prescription for norco 5/325 1-2 tabs PO q6 hours prn pain, dispense #20.    [name],Cheryl Soper R, MD Electronically signed, 02/10/18

## 2018-02-10 NOTE — Transfer of Care (Signed)
Immediate Anesthesia Transfer of Care Note  Patient: [name]  Procedure(s) Performed: RIGHT CARPAL TUNNEL RELEASE (Right Wrist)  Patient Location: PACU  Anesthesia Type:General  Level of Consciousness: sedated  Airway & Oxygen Therapy: Patient Spontanous Breathing and Patient connected to face mask oxygen  Post-op Assessment: Report given to RN and Post -op Vital signs reviewed and stable  Post vital signs: Reviewed and stable  Last Vitals:  Vitals:   02/10/18 1313  BP: (!) 143/79  Pulse: (!) 58  Resp: 18  Temp: 36.4 C  SpO2: 99%    Last Pain:  Vitals:   02/10/18 1313  TempSrc: Oral         Complications: No apparent anesthesia complications

## 2018-02-10 NOTE — Anesthesia Postprocedure Evaluation (Signed)
Anesthesia Post Note  Patient: [name]  Procedure(s) Performed: RIGHT CARPAL TUNNEL RELEASE (Right Wrist)     Patient location during evaluation: PACU Anesthesia Type: General Level of consciousness: awake and alert Pain management: pain level controlled Vital Signs Assessment: post-procedure vital signs reviewed and stable Respiratory status: spontaneous breathing, nonlabored ventilation, respiratory function stable and patient connected to nasal cannula oxygen Cardiovascular status: blood pressure returned to baseline and stable Postop Assessment: no apparent nausea or vomiting Anesthetic complications: no    Last Vitals:  Vitals:   02/10/18 1530 02/10/18 1545  BP: 139/85 136/77  Pulse: 69 77  Resp: 10 14  Temp:    SpO2: 95% 92%    Last Pain:  Vitals:   02/10/18 1545  TempSrc:   PainSc: 5                  Doylene Splinter

## 2018-02-10 NOTE — Anesthesia Preprocedure Evaluation (Addendum)
Anesthesia Evaluation  Patient identified by MRN, date of birth, ID band Patient awake    Reviewed: Allergy & Precautions, NPO status , Patient's Chart, lab work & pertinent test results  History of Anesthesia Complications Negative for: history of anesthetic complications  Airway Mallampati: I  TM Distance: >3 FB Neck ROM: Full    Dental  (+) Teeth Intact   Pulmonary neg pulmonary ROS,    breath sounds clear to auscultation       Cardiovascular negative cardio ROS   Rhythm:Regular     Neuro/Psych PSYCHIATRIC DISORDERS Anxiety Depression negative neurological ROS     GI/Hepatic Neg liver ROS, GERD  Medicated and Controlled,  Endo/Other  Hypothyroidism   Renal/GU      Musculoskeletal negative musculoskeletal ROS (+)   Abdominal   Peds  Hematology negative hematology ROS (+)   Anesthesia Other Findings   Reproductive/Obstetrics                             Anesthesia Physical Anesthesia Plan  ASA: II  Anesthesia Plan: General   Post-op Pain Management:    Induction:   PONV Risk Score and Plan: 3 and Ondansetron and Dexamethasone  Airway Management Planned: LMA  Additional Equipment: None  Intra-op Plan:   Post-operative Plan: Extubation in OR  Informed Consent: I have reviewed the patients History and Physical, chart, labs and discussed the procedure including the risks, benefits and alternatives for the proposed anesthesia with the patient or authorized representative who has indicated his/her understanding and acceptance.   Dental advisory given  Plan Discussed with: CRNA and Surgeon  Anesthesia Plan Comments:        Anesthesia Quick Evaluation

## 2018-02-10 NOTE — Brief Op Note (Signed)
02/10/2018  2:47 PM  PATIENT:  [name]  72 y.o. female  PRE-OPERATIVE DIAGNOSIS:  RIGHT CARPAL TUNNEL SYNDROME  POST-OPERATIVE DIAGNOSIS:  RIGHT CARPAL TUNNEL SYNDROME  PROCEDURE:  Procedure(s): RIGHT CARPAL TUNNEL RELEASE (Right)  SURGEON:  Surgeon(s) and Role:    * Betha LoaKuzma, Prudie Guthridge, MD - Primary  PHYSICIAN ASSISTANT:   ASSISTANTS: none   ANESTHESIA:   general  EBL:  4 mL   BLOOD ADMINISTERED:none  DRAINS: none   LOCAL MEDICATIONS USED:  NONE  SPECIMEN:  No Specimen  DISPOSITION OF SPECIMEN:  N/A  COUNTS:  YES  TOURNIQUET:   Total Tourniquet Time Documented: Forearm (Right) - 15 minutes Total: Forearm (Right) - 15 minutes   DICTATION: .Note written in EPIC  PLAN OF CARE: Discharge to home after PACU  PATIENT DISPOSITION:  PACU - hemodynamically stable.

## 2018-02-11 ENCOUNTER — Encounter (HOSPITAL_BASED_OUTPATIENT_CLINIC_OR_DEPARTMENT_OTHER): Payer: Self-pay | Admitting: Orthopedic Surgery

## 2018-02-21 DIAGNOSIS — Z139 Encounter for screening, unspecified: Secondary | ICD-10-CM | POA: Diagnosis not present

## 2018-02-21 DIAGNOSIS — E039 Hypothyroidism, unspecified: Secondary | ICD-10-CM | POA: Diagnosis not present

## 2018-02-21 DIAGNOSIS — F418 Other specified anxiety disorders: Secondary | ICD-10-CM | POA: Diagnosis not present

## 2018-02-21 DIAGNOSIS — Z6834 Body mass index (BMI) 34.0-34.9, adult: Secondary | ICD-10-CM | POA: Diagnosis not present

## 2018-02-21 DIAGNOSIS — K219 Gastro-esophageal reflux disease without esophagitis: Secondary | ICD-10-CM | POA: Diagnosis not present

## 2018-02-21 DIAGNOSIS — M8589 Other specified disorders of bone density and structure, multiple sites: Secondary | ICD-10-CM | POA: Diagnosis not present

## 2018-02-21 DIAGNOSIS — Z1331 Encounter for screening for depression: Secondary | ICD-10-CM | POA: Diagnosis not present

## 2018-02-21 DIAGNOSIS — E559 Vitamin D deficiency, unspecified: Secondary | ICD-10-CM | POA: Diagnosis not present

## 2018-02-21 DIAGNOSIS — Z1231 Encounter for screening mammogram for malignant neoplasm of breast: Secondary | ICD-10-CM | POA: Diagnosis not present

## 2018-02-24 ENCOUNTER — Telehealth: Payer: Self-pay

## 2018-02-24 NOTE — Telephone Encounter (Signed)
Yes

## 2018-02-24 NOTE — Telephone Encounter (Signed)
Re: Levothyroxin 100 mcg tab  Note to Prescriber: "The manufacturer she has been getting Rx from is not available right now. Can we change?"

## 2018-02-25 NOTE — Telephone Encounter (Signed)
Pharmacy notified.

## 2018-03-04 DIAGNOSIS — Z9989 Dependence on other enabling machines and devices: Secondary | ICD-10-CM | POA: Diagnosis not present

## 2018-03-06 DIAGNOSIS — Z9989 Dependence on other enabling machines and devices: Secondary | ICD-10-CM | POA: Diagnosis not present

## 2018-03-11 DIAGNOSIS — Z9989 Dependence on other enabling machines and devices: Secondary | ICD-10-CM | POA: Diagnosis not present

## 2018-03-13 DIAGNOSIS — R1310 Dysphagia, unspecified: Secondary | ICD-10-CM | POA: Diagnosis not present

## 2018-03-13 DIAGNOSIS — Z9989 Dependence on other enabling machines and devices: Secondary | ICD-10-CM | POA: Diagnosis not present

## 2018-03-13 DIAGNOSIS — K210 Gastro-esophageal reflux disease with esophagitis: Secondary | ICD-10-CM | POA: Diagnosis not present

## 2018-03-13 DIAGNOSIS — K219 Gastro-esophageal reflux disease without esophagitis: Secondary | ICD-10-CM | POA: Diagnosis not present

## 2018-03-18 DIAGNOSIS — Z9989 Dependence on other enabling machines and devices: Secondary | ICD-10-CM | POA: Diagnosis not present

## 2018-03-19 DIAGNOSIS — R269 Unspecified abnormalities of gait and mobility: Secondary | ICD-10-CM | POA: Diagnosis not present

## 2018-03-19 DIAGNOSIS — M5412 Radiculopathy, cervical region: Secondary | ICD-10-CM | POA: Diagnosis not present

## 2018-03-20 DIAGNOSIS — K219 Gastro-esophageal reflux disease without esophagitis: Secondary | ICD-10-CM | POA: Diagnosis not present

## 2018-03-20 DIAGNOSIS — Z800 Family history of malignant neoplasm of digestive organs: Secondary | ICD-10-CM | POA: Diagnosis not present

## 2018-03-20 DIAGNOSIS — R1310 Dysphagia, unspecified: Secondary | ICD-10-CM | POA: Diagnosis not present

## 2018-03-20 DIAGNOSIS — K449 Diaphragmatic hernia without obstruction or gangrene: Secondary | ICD-10-CM | POA: Diagnosis not present

## 2018-03-20 DIAGNOSIS — K208 Other esophagitis: Secondary | ICD-10-CM | POA: Diagnosis not present

## 2018-03-20 DIAGNOSIS — K222 Esophageal obstruction: Secondary | ICD-10-CM | POA: Diagnosis not present

## 2018-03-20 DIAGNOSIS — Z79899 Other long term (current) drug therapy: Secondary | ICD-10-CM | POA: Diagnosis not present

## 2018-03-21 DIAGNOSIS — M8589 Other specified disorders of bone density and structure, multiple sites: Secondary | ICD-10-CM | POA: Diagnosis not present

## 2018-03-21 DIAGNOSIS — Z9989 Dependence on other enabling machines and devices: Secondary | ICD-10-CM | POA: Diagnosis not present

## 2018-03-21 DIAGNOSIS — Z1231 Encounter for screening mammogram for malignant neoplasm of breast: Secondary | ICD-10-CM | POA: Diagnosis not present

## 2018-03-24 DIAGNOSIS — R1310 Dysphagia, unspecified: Secondary | ICD-10-CM | POA: Diagnosis not present

## 2018-03-24 DIAGNOSIS — K219 Gastro-esophageal reflux disease without esophagitis: Secondary | ICD-10-CM | POA: Diagnosis not present

## 2018-03-24 DIAGNOSIS — K222 Esophageal obstruction: Secondary | ICD-10-CM | POA: Diagnosis not present

## 2018-03-27 DIAGNOSIS — Z9989 Dependence on other enabling machines and devices: Secondary | ICD-10-CM | POA: Diagnosis not present

## 2018-04-01 DIAGNOSIS — Z9989 Dependence on other enabling machines and devices: Secondary | ICD-10-CM | POA: Diagnosis not present

## 2018-04-03 DIAGNOSIS — M6281 Muscle weakness (generalized): Secondary | ICD-10-CM | POA: Diagnosis not present

## 2018-04-03 DIAGNOSIS — R202 Paresthesia of skin: Secondary | ICD-10-CM | POA: Diagnosis not present

## 2018-04-03 DIAGNOSIS — M25631 Stiffness of right wrist, not elsewhere classified: Secondary | ICD-10-CM | POA: Diagnosis not present

## 2018-04-03 DIAGNOSIS — G5601 Carpal tunnel syndrome, right upper limb: Secondary | ICD-10-CM | POA: Diagnosis not present

## 2018-04-10 DIAGNOSIS — G5601 Carpal tunnel syndrome, right upper limb: Secondary | ICD-10-CM | POA: Diagnosis not present

## 2018-04-10 DIAGNOSIS — M6281 Muscle weakness (generalized): Secondary | ICD-10-CM | POA: Diagnosis not present

## 2018-04-10 DIAGNOSIS — R202 Paresthesia of skin: Secondary | ICD-10-CM | POA: Diagnosis not present

## 2018-04-10 DIAGNOSIS — M25631 Stiffness of right wrist, not elsewhere classified: Secondary | ICD-10-CM | POA: Diagnosis not present

## 2018-04-15 DIAGNOSIS — M6281 Muscle weakness (generalized): Secondary | ICD-10-CM | POA: Diagnosis not present

## 2018-04-15 DIAGNOSIS — G5601 Carpal tunnel syndrome, right upper limb: Secondary | ICD-10-CM | POA: Diagnosis not present

## 2018-04-15 DIAGNOSIS — M25631 Stiffness of right wrist, not elsewhere classified: Secondary | ICD-10-CM | POA: Diagnosis not present

## 2018-04-15 DIAGNOSIS — R202 Paresthesia of skin: Secondary | ICD-10-CM | POA: Diagnosis not present

## 2018-04-17 DIAGNOSIS — M6281 Muscle weakness (generalized): Secondary | ICD-10-CM | POA: Diagnosis not present

## 2018-04-17 DIAGNOSIS — G5601 Carpal tunnel syndrome, right upper limb: Secondary | ICD-10-CM | POA: Diagnosis not present

## 2018-04-17 DIAGNOSIS — M25631 Stiffness of right wrist, not elsewhere classified: Secondary | ICD-10-CM | POA: Diagnosis not present

## 2018-04-17 DIAGNOSIS — R202 Paresthesia of skin: Secondary | ICD-10-CM | POA: Diagnosis not present

## 2018-04-22 DIAGNOSIS — M25631 Stiffness of right wrist, not elsewhere classified: Secondary | ICD-10-CM | POA: Diagnosis not present

## 2018-04-22 DIAGNOSIS — M6281 Muscle weakness (generalized): Secondary | ICD-10-CM | POA: Diagnosis not present

## 2018-04-22 DIAGNOSIS — G5601 Carpal tunnel syndrome, right upper limb: Secondary | ICD-10-CM | POA: Diagnosis not present

## 2018-04-22 DIAGNOSIS — R202 Paresthesia of skin: Secondary | ICD-10-CM | POA: Diagnosis not present

## 2018-05-01 DIAGNOSIS — G5601 Carpal tunnel syndrome, right upper limb: Secondary | ICD-10-CM | POA: Diagnosis not present

## 2018-05-01 DIAGNOSIS — M6281 Muscle weakness (generalized): Secondary | ICD-10-CM | POA: Diagnosis not present

## 2018-05-01 DIAGNOSIS — M25631 Stiffness of right wrist, not elsewhere classified: Secondary | ICD-10-CM | POA: Diagnosis not present

## 2018-05-01 DIAGNOSIS — R202 Paresthesia of skin: Secondary | ICD-10-CM | POA: Diagnosis not present

## 2018-05-06 DIAGNOSIS — G5601 Carpal tunnel syndrome, right upper limb: Secondary | ICD-10-CM | POA: Diagnosis not present

## 2018-05-06 DIAGNOSIS — R202 Paresthesia of skin: Secondary | ICD-10-CM | POA: Diagnosis not present

## 2018-05-06 DIAGNOSIS — M25631 Stiffness of right wrist, not elsewhere classified: Secondary | ICD-10-CM | POA: Diagnosis not present

## 2018-05-06 DIAGNOSIS — M6281 Muscle weakness (generalized): Secondary | ICD-10-CM | POA: Diagnosis not present

## 2018-05-08 DIAGNOSIS — G5601 Carpal tunnel syndrome, right upper limb: Secondary | ICD-10-CM | POA: Diagnosis not present

## 2018-05-08 DIAGNOSIS — R202 Paresthesia of skin: Secondary | ICD-10-CM | POA: Diagnosis not present

## 2018-05-08 DIAGNOSIS — M6281 Muscle weakness (generalized): Secondary | ICD-10-CM | POA: Diagnosis not present

## 2018-05-08 DIAGNOSIS — M25631 Stiffness of right wrist, not elsewhere classified: Secondary | ICD-10-CM | POA: Diagnosis not present

## 2018-05-20 DIAGNOSIS — G5601 Carpal tunnel syndrome, right upper limb: Secondary | ICD-10-CM | POA: Diagnosis not present

## 2018-05-20 DIAGNOSIS — M6281 Muscle weakness (generalized): Secondary | ICD-10-CM | POA: Diagnosis not present

## 2018-05-20 DIAGNOSIS — M25631 Stiffness of right wrist, not elsewhere classified: Secondary | ICD-10-CM | POA: Diagnosis not present

## 2018-05-20 DIAGNOSIS — R202 Paresthesia of skin: Secondary | ICD-10-CM | POA: Diagnosis not present

## 2018-05-22 DIAGNOSIS — G5601 Carpal tunnel syndrome, right upper limb: Secondary | ICD-10-CM | POA: Diagnosis not present

## 2018-05-22 DIAGNOSIS — M6281 Muscle weakness (generalized): Secondary | ICD-10-CM | POA: Diagnosis not present

## 2018-05-22 DIAGNOSIS — M25631 Stiffness of right wrist, not elsewhere classified: Secondary | ICD-10-CM | POA: Diagnosis not present

## 2018-05-22 DIAGNOSIS — R202 Paresthesia of skin: Secondary | ICD-10-CM | POA: Diagnosis not present

## 2018-05-27 DIAGNOSIS — M6281 Muscle weakness (generalized): Secondary | ICD-10-CM | POA: Diagnosis not present

## 2018-05-27 DIAGNOSIS — R202 Paresthesia of skin: Secondary | ICD-10-CM | POA: Diagnosis not present

## 2018-05-27 DIAGNOSIS — M25631 Stiffness of right wrist, not elsewhere classified: Secondary | ICD-10-CM | POA: Diagnosis not present

## 2018-05-27 DIAGNOSIS — G5601 Carpal tunnel syndrome, right upper limb: Secondary | ICD-10-CM | POA: Diagnosis not present

## 2018-05-29 DIAGNOSIS — R202 Paresthesia of skin: Secondary | ICD-10-CM | POA: Diagnosis not present

## 2018-05-29 DIAGNOSIS — M6281 Muscle weakness (generalized): Secondary | ICD-10-CM | POA: Diagnosis not present

## 2018-05-29 DIAGNOSIS — M25631 Stiffness of right wrist, not elsewhere classified: Secondary | ICD-10-CM | POA: Diagnosis not present

## 2018-05-29 DIAGNOSIS — G5601 Carpal tunnel syndrome, right upper limb: Secondary | ICD-10-CM | POA: Diagnosis not present

## 2018-06-02 DIAGNOSIS — K219 Gastro-esophageal reflux disease without esophagitis: Secondary | ICD-10-CM | POA: Diagnosis not present

## 2018-06-02 DIAGNOSIS — K222 Esophageal obstruction: Secondary | ICD-10-CM | POA: Diagnosis not present

## 2018-06-04 DIAGNOSIS — Z0289 Encounter for other administrative examinations: Secondary | ICD-10-CM

## 2018-06-27 DIAGNOSIS — R269 Unspecified abnormalities of gait and mobility: Secondary | ICD-10-CM | POA: Diagnosis not present

## 2018-06-27 DIAGNOSIS — M5412 Radiculopathy, cervical region: Secondary | ICD-10-CM | POA: Diagnosis not present

## 2018-06-27 DIAGNOSIS — M542 Cervicalgia: Secondary | ICD-10-CM | POA: Diagnosis not present

## 2018-07-09 DIAGNOSIS — M5412 Radiculopathy, cervical region: Secondary | ICD-10-CM | POA: Insufficient documentation

## 2018-07-09 DIAGNOSIS — M542 Cervicalgia: Secondary | ICD-10-CM | POA: Diagnosis not present

## 2019-02-05 ENCOUNTER — Ambulatory Visit: Payer: Medicare Other | Admitting: Obstetrics & Gynecology

## 2019-02-05 ENCOUNTER — Encounter: Payer: Self-pay | Admitting: Obstetrics & Gynecology

## 2019-02-05 VITALS — BP 126/78 | Ht 59.0 in | Wt 158.0 lb

## 2019-02-05 DIAGNOSIS — E039 Hypothyroidism, unspecified: Secondary | ICD-10-CM

## 2019-02-05 DIAGNOSIS — M8589 Other specified disorders of bone density and structure, multiple sites: Secondary | ICD-10-CM | POA: Diagnosis not present

## 2019-02-05 DIAGNOSIS — F419 Anxiety disorder, unspecified: Secondary | ICD-10-CM

## 2019-02-05 DIAGNOSIS — Z780 Asymptomatic menopausal state: Secondary | ICD-10-CM | POA: Diagnosis not present

## 2019-02-05 DIAGNOSIS — Z01419 Encounter for gynecological examination (general) (routine) without abnormal findings: Secondary | ICD-10-CM

## 2019-02-05 MED ORDER — PAROXETINE HCL ER 25 MG PO TB24: 25.0000 mg | ORAL_TABLET | ORAL | 4 refills | Status: DC

## 2019-02-05 MED ORDER — LEVOTHYROXINE SODIUM 100 MCG PO TABS: 100.0000 ug | ORAL_TABLET | Freq: Every day | ORAL | 4 refills | Status: DC

## 2019-02-05 NOTE — Progress Notes (Signed)
[name] 07-Feb-1946 [id number]   History:    73 y.o. [id number] Married.  Has grand-children.  RP: Established patient presenting for annual gyn exam   HPI: Postmenopausal, well on no HRT.  No PMB.  No pelvic pain.  Abstinent. Urine/BMs normal.  Breasts normal.  BMI 31.91.  Health labs with Fam MD.  Past medical history,surgical history, family history and social history were all reviewed and documented in the EPIC chart.  Gynecologic History Patient's last menstrual period was 07/18/2007. Contraception: abstinence and post menopausal status Last Pap: 01/2018. Results were: Negative Last mammogram: 2019.  Will obtain report Bone Density: 2019.  Will obtain report Colonoscopy: 2015, scheduled 02/2019.  Obstetric History OB History  Gravida Para Term Preterm AB Living  6 3 3   3 3   SAB TAB Ectopic Multiple Live Births  3       3    # Outcome Date GA Lbr Len/2nd Weight Sex Delivery Anes PTL Lv  6 Term 10/10/83    F Vag-Spont  N LIV  5 Term 11/10/75    M Vag-Spont  N LIV  4 Term 04/04/72    F Vag-Spont  N LIV  3 SAB           2 SAB           1 SAB              ROS: A ROS was performed and pertinent positives and negatives are included in the history.  GENERAL: No fevers or chills. HEENT: No change in vision, no earache, sore throat or sinus congestion. NECK: No pain or stiffness. CARDIOVASCULAR: No chest pain or pressure. No palpitations. PULMONARY: No shortness of breath, cough or wheeze. GASTROINTESTINAL: No abdominal pain, nausea, vomiting or diarrhea, melena or bright red blood per rectum. GENITOURINARY: No urinary frequency, urgency, hesitancy or dysuria. MUSCULOSKELETAL: No joint or muscle pain, no back pain, no recent trauma. DERMATOLOGIC: No rash, no itching, no lesions. ENDOCRINE: No polyuria, polydipsia, no heat or cold intolerance. No recent change in weight. HEMATOLOGICAL: No anemia or easy bruising or bleeding. NEUROLOGIC: No headache, seizures, numbness,  tingling or weakness. PSYCHIATRIC: No depression, no loss of interest in normal activity or change in sleep pattern.     Exam:   BP 126/78   Ht 4' 11"  (1.499 m)   Wt 158 lb (71.7 kg)   LMP 07/18/2007   BMI 31.91 kg/m    Body mass index is 31.91 kg/m.  General appearance : Well developed well nourished female. No acute distress HEENT: Eyes: no retinal hemorrhage or exudates,  Neck supple, trachea midline, no carotid bruits, no thyroidmegaly Lungs: Clear to auscultation, no rhonchi or wheezes, or rib retractions  Heart: Regular rate and rhythm, no murmurs or gallops Breast:Examined in sitting and supine position were symmetrical in appearance, no palpable masses or tenderness,  no skin retraction, no nipple inversion, no nipple discharge, no skin discoloration, no axillary or supraclavicular lymphadenopathy Abdomen: no palpable masses or tenderness, no rebound or guarding Extremities: no edema or skin discoloration or tenderness  Pelvic: Vulva: Normal             Vagina: No gross lesions or discharge  Cervix: No gross lesions or discharge  Uterus  AV, normal size, shape and consistency, non-tender and mobile  Adnexa  Without masses or tenderness  Anus: Normal   Assessment/Plan:  73 y.o. female for annual exam   1. Well female exam with routine gynecological exam  Normal gynecologic exam and menopause.  Last Pap test in February 2019 was negative, no indication to repeat this year.  Breast exam normal.  Screening mammogram in 2019 was normal per patient, will obtain report.  Colonoscopy in 2015, scheduled March 2020.  Recommend patient finds a Fam MD.  For now, f/u here for Fasting Health Labs.  - CBC; Future - Comp Met (CMET); Future - TSH; Future - Lipid panel; Future - VITAMIN D 25 Hydroxy (Vit-D Deficiency, Fractures); Future  2. Postmenopausal Well on no hormone replacement therapy.  No postmenopausal bleeding.  3. Osteopenia of multiple sites Osteopenia per last bone  density in 2019, will obtain report.  Continue with vitamin D supplements and calcium intake of 1500 mg daily including nutritional and supplemental, recommend weightbearing physical activities on a regular basis.  4. Anxiety Well controled on Paxil.  Represcribed. - PARoxetine (PAXIL-CR) 25 MG 24 hr tablet; Take 1 tablet (25 mg total) by mouth every morning.  5. Acquired hypothyroidism Stable on Synthroid, same prescription sent to pharmacy.  Will repeat TSH now.  Other orders - levothyroxine (SYNTHROID, LEVOTHROID) 100 MCG tablet; Take 1 tablet (100 mcg total) by mouth daily.  Counseling on above issues and coordination of care >50% x 15 minutes.  [name] MD, 11:31 AM 02/05/2019

## 2019-02-06 ENCOUNTER — Encounter: Payer: Self-pay | Admitting: Obstetrics & Gynecology

## 2019-02-06 NOTE — Patient Instructions (Signed)
1. Well female exam with routine gynecological exam Normal gynecologic exam and menopause.  Last Pap test in February 2019 was negative, no indication to repeat this year.  Breast exam normal.  Screening mammogram in 2019 was normal per patient, will obtain report.  Colonoscopy in 2015, scheduled March 2020.  Recommend patient finds a Fam MD.  For now, f/u here for Fasting Health Labs.  - CBC; Future - Comp Met (CMET); Future - TSH; Future - Lipid panel; Future - VITAMIN D 25 Hydroxy (Vit-D Deficiency, Fractures); Future  2. Postmenopausal Well on no hormone replacement therapy.  No postmenopausal bleeding.  3. Osteopenia of multiple sites Osteopenia per last bone density in 2019, will obtain report.  Continue with vitamin D supplements and calcium intake of 1500 mg daily including nutritional and supplemental, recommend weightbearing physical activities on a regular basis.  4. Anxiety Well controled on Paxil.  Represcribed. - PARoxetine (PAXIL-CR) 25 MG 24 hr tablet; Take 1 tablet (25 mg total) by mouth every morning.  5. Acquired hypothyroidism Stable on Synthroid, same prescription sent to pharmacy.  Will repeat TSH now.  Other orders - levothyroxine (SYNTHROID, LEVOTHROID) 100 MCG tablet; Take 1 tablet (100 mcg total) by mouth daily.  Nyelah, it was a pleasure seeing you today!  I will inform you of your results as soon as they are available.

## 2019-02-25 ENCOUNTER — Telehealth: Payer: Self-pay | Admitting: *Deleted

## 2019-02-25 DIAGNOSIS — K222 Esophageal obstruction: Secondary | ICD-10-CM | POA: Diagnosis not present

## 2019-02-25 MED ORDER — PAROXETINE HCL 30 MG PO TABS: 30.0000 mg | ORAL_TABLET | Freq: Every day | ORAL | 5 refills | Status: DC

## 2019-02-25 NOTE — Telephone Encounter (Signed)
Agree with Paxil 30 mg daily.  #30 refill x 5.

## 2019-02-25 NOTE — Telephone Encounter (Signed)
Patient called back and said her insurance is not longer covering the Paxil-CR 25 mg tablet and Rx is too expensive to pay out of pocket. Asked if she could go back on Paxil 30 mg dose ( which is not time released)  Patient said Dr. Lily Peer prescribed this in the past. Please advise

## 2019-02-25 NOTE — Telephone Encounter (Signed)
Patient informed, Rx sent.  

## 2019-02-25 NOTE — Telephone Encounter (Signed)
Patient called and left message in triage voicemail regarding Paxil 25 mg tablet, left message for patient to call.

## 2019-03-04 DIAGNOSIS — R6889 Other general symptoms and signs: Secondary | ICD-10-CM | POA: Diagnosis not present

## 2019-03-05 DIAGNOSIS — K5730 Diverticulosis of large intestine without perforation or abscess without bleeding: Secondary | ICD-10-CM | POA: Diagnosis not present

## 2019-03-05 DIAGNOSIS — Z1211 Encounter for screening for malignant neoplasm of colon: Secondary | ICD-10-CM | POA: Diagnosis not present

## 2019-03-05 DIAGNOSIS — Z800 Family history of malignant neoplasm of digestive organs: Secondary | ICD-10-CM | POA: Diagnosis not present

## 2019-03-05 DIAGNOSIS — Z86010 Personal history of colonic polyps: Secondary | ICD-10-CM | POA: Diagnosis not present

## 2019-03-05 DIAGNOSIS — K222 Esophageal obstruction: Secondary | ICD-10-CM | POA: Diagnosis not present

## 2019-03-05 DIAGNOSIS — K449 Diaphragmatic hernia without obstruction or gangrene: Secondary | ICD-10-CM | POA: Diagnosis not present

## 2019-03-05 DIAGNOSIS — E079 Disorder of thyroid, unspecified: Secondary | ICD-10-CM | POA: Diagnosis not present

## 2019-03-05 DIAGNOSIS — K219 Gastro-esophageal reflux disease without esophagitis: Secondary | ICD-10-CM | POA: Diagnosis not present

## 2019-03-05 DIAGNOSIS — M1990 Unspecified osteoarthritis, unspecified site: Secondary | ICD-10-CM | POA: Diagnosis not present

## 2019-03-05 DIAGNOSIS — Z79899 Other long term (current) drug therapy: Secondary | ICD-10-CM | POA: Diagnosis not present

## 2019-04-15 ENCOUNTER — Encounter: Payer: Self-pay | Admitting: Internal Medicine

## 2019-05-13 DIAGNOSIS — R6889 Other general symptoms and signs: Secondary | ICD-10-CM | POA: Diagnosis not present

## 2019-06-17 DIAGNOSIS — K222 Esophageal obstruction: Secondary | ICD-10-CM | POA: Diagnosis not present

## 2019-06-17 DIAGNOSIS — D509 Iron deficiency anemia, unspecified: Secondary | ICD-10-CM | POA: Diagnosis not present

## 2019-06-17 DIAGNOSIS — K219 Gastro-esophageal reflux disease without esophagitis: Secondary | ICD-10-CM | POA: Diagnosis not present

## 2019-06-17 DIAGNOSIS — Z9181 History of falling: Secondary | ICD-10-CM | POA: Diagnosis not present

## 2019-06-17 DIAGNOSIS — E785 Hyperlipidemia, unspecified: Secondary | ICD-10-CM | POA: Diagnosis not present

## 2019-06-17 DIAGNOSIS — E559 Vitamin D deficiency, unspecified: Secondary | ICD-10-CM | POA: Diagnosis not present

## 2019-06-17 DIAGNOSIS — E039 Hypothyroidism, unspecified: Secondary | ICD-10-CM | POA: Diagnosis not present

## 2019-06-17 DIAGNOSIS — Z139 Encounter for screening, unspecified: Secondary | ICD-10-CM | POA: Diagnosis not present

## 2019-06-25 DIAGNOSIS — M5412 Radiculopathy, cervical region: Secondary | ICD-10-CM | POA: Diagnosis not present

## 2019-06-25 DIAGNOSIS — M542 Cervicalgia: Secondary | ICD-10-CM | POA: Diagnosis not present

## 2019-07-29 ENCOUNTER — Telehealth: Payer: Self-pay

## 2019-07-29 NOTE — Telephone Encounter (Signed)
Patient called stating she needs her generic Synthroid sent to [facility] because she needs it. I called [facility] and they did have Rx from Feb good for one year but needed to confirm okay for different manufacturer. I okayed that. Informed patient that they will have it ready for her and what had happened.

## 2019-08-03 DIAGNOSIS — E785 Hyperlipidemia, unspecified: Secondary | ICD-10-CM | POA: Diagnosis not present

## 2019-08-03 DIAGNOSIS — Z9181 History of falling: Secondary | ICD-10-CM | POA: Diagnosis not present

## 2019-08-03 DIAGNOSIS — Z0000 Encounter for general adult medical examination without abnormal findings: Secondary | ICD-10-CM | POA: Diagnosis not present

## 2019-09-04 DIAGNOSIS — M5412 Radiculopathy, cervical region: Secondary | ICD-10-CM | POA: Diagnosis not present

## 2019-09-04 DIAGNOSIS — R030 Elevated blood-pressure reading, without diagnosis of hypertension: Secondary | ICD-10-CM | POA: Diagnosis not present

## 2019-09-04 DIAGNOSIS — M5020 Other cervical disc displacement, unspecified cervical region: Secondary | ICD-10-CM | POA: Diagnosis not present

## 2019-09-09 ENCOUNTER — Telehealth: Payer: Self-pay

## 2019-09-09 NOTE — Telephone Encounter (Signed)
Yes, can refill until 01/2020.

## 2019-09-09 NOTE — Telephone Encounter (Signed)
When patient picked up her Paxil Rx today pharmacy told her that was her last refill.  SHe thought you send the Rx for a year. CE was in Feb but this Rx was prescribed 02/25/19 and you wrote "   Agree with Paxil 30 mg daily.  #30 refill x 5.     Ok to give her refills until 01/2020 when CE due?

## 2019-09-10 MED ORDER — PAROXETINE HCL 30 MG PO TABS: 30.0000 mg | ORAL_TABLET | Freq: Every day | ORAL | 5 refills | Status: DC

## 2019-09-10 NOTE — Telephone Encounter (Signed)
Refills sent. Patient informed ?

## 2019-11-09 DIAGNOSIS — H2513 Age-related nuclear cataract, bilateral: Secondary | ICD-10-CM | POA: Diagnosis not present

## 2020-01-11 DIAGNOSIS — M4312 Spondylolisthesis, cervical region: Secondary | ICD-10-CM | POA: Diagnosis not present

## 2020-01-11 DIAGNOSIS — M4802 Spinal stenosis, cervical region: Secondary | ICD-10-CM | POA: Diagnosis not present

## 2020-01-11 DIAGNOSIS — M5412 Radiculopathy, cervical region: Secondary | ICD-10-CM | POA: Diagnosis not present

## 2020-01-20 DIAGNOSIS — M25512 Pain in left shoulder: Secondary | ICD-10-CM | POA: Diagnosis not present

## 2020-02-16 DIAGNOSIS — J0100 Acute maxillary sinusitis, unspecified: Secondary | ICD-10-CM | POA: Diagnosis not present

## 2020-03-29 HISTORY — PX: CERVICAL SPINE SURGERY: SHX589

## 2020-03-30 DIAGNOSIS — M5412 Radiculopathy, cervical region: Secondary | ICD-10-CM | POA: Diagnosis not present

## 2020-03-30 DIAGNOSIS — M5413 Radiculopathy, cervicothoracic region: Secondary | ICD-10-CM | POA: Diagnosis not present

## 2020-03-30 DIAGNOSIS — M4802 Spinal stenosis, cervical region: Secondary | ICD-10-CM | POA: Diagnosis not present

## 2020-04-04 ENCOUNTER — Other Ambulatory Visit: Payer: Self-pay

## 2020-04-05 ENCOUNTER — Telehealth: Payer: Self-pay | Admitting: *Deleted

## 2020-04-05 ENCOUNTER — Encounter: Payer: Medicare Other | Admitting: Obstetrics & Gynecology

## 2020-04-05 NOTE — Telephone Encounter (Signed)
-----   Message from Jerilynn Mages sent at 04/05/2020  8:25 AM EDT ----- Regarding: RX Patient had surgery and is on bed rest for 2 weeks. We reschedule her appointment for May 14 but she does need refills on her RX. Thx

## 2020-04-05 NOTE — Telephone Encounter (Signed)
I called patient and left detailed message on to call me back to find out which Rx she needs refill on.

## 2020-04-18 DIAGNOSIS — M4802 Spinal stenosis, cervical region: Secondary | ICD-10-CM | POA: Diagnosis not present

## 2020-05-06 ENCOUNTER — Encounter: Payer: Medicare Other | Admitting: Obstetrics & Gynecology

## 2020-05-16 DIAGNOSIS — G5601 Carpal tunnel syndrome, right upper limb: Secondary | ICD-10-CM | POA: Diagnosis not present

## 2020-05-16 DIAGNOSIS — M5412 Radiculopathy, cervical region: Secondary | ICD-10-CM | POA: Diagnosis not present

## 2020-05-18 ENCOUNTER — Other Ambulatory Visit: Payer: Self-pay

## 2020-05-18 ENCOUNTER — Ambulatory Visit: Payer: Medicare Other | Admitting: Obstetrics & Gynecology

## 2020-05-18 ENCOUNTER — Encounter: Payer: Self-pay | Admitting: Obstetrics & Gynecology

## 2020-05-18 VITALS — BP 132/82 | Ht 58.75 in | Wt 165.0 lb

## 2020-05-18 DIAGNOSIS — Z01419 Encounter for gynecological examination (general) (routine) without abnormal findings: Secondary | ICD-10-CM

## 2020-05-18 DIAGNOSIS — Z780 Asymptomatic menopausal state: Secondary | ICD-10-CM

## 2020-05-18 DIAGNOSIS — M8589 Other specified disorders of bone density and structure, multiple sites: Secondary | ICD-10-CM | POA: Diagnosis not present

## 2020-05-18 NOTE — Progress Notes (Signed)
[name] Jan 22, 1946 [id number]   History:    74 y.o.  [id number] Married.  Has grand-children.  RP: Established patient presenting for annual gyn exam   HPI: Postmenopausal, well on no HRT.  No PMB.  No pelvic pain.  Abstinent. Urine/BMs normal.  Breasts normal.  BMI 33.61.  Health labs with Fam MD.   Past medical history,surgical history, family history and social history were all reviewed and documented in the EPIC chart.  Gynecologic History Patient's last menstrual period was 07/18/2007.  Obstetric History OB History  Gravida Para Term Preterm AB Living  6 3 3   3 3   SAB TAB Ectopic Multiple Live Births  3       3    # Outcome Date GA Lbr Len/2nd Weight Sex Delivery Anes PTL Lv  6 Term 10/10/83    F Vag-Spont  N LIV  5 Term 11/10/75    M Vag-Spont  N LIV  4 Term 04/04/72    F Vag-Spont  N LIV  3 SAB           2 SAB           1 SAB              ROS: A ROS was performed and pertinent positives and negatives are included in the history.  GENERAL: No fevers or chills. HEENT: No change in vision, no earache, sore throat or sinus congestion. NECK: No pain or stiffness. CARDIOVASCULAR: No chest pain or pressure. No palpitations. PULMONARY: No shortness of breath, cough or wheeze. GASTROINTESTINAL: No abdominal pain, nausea, vomiting or diarrhea, melena or bright red blood per rectum. GENITOURINARY: No urinary frequency, urgency, hesitancy or dysuria. MUSCULOSKELETAL: No joint or muscle pain, no back pain, no recent trauma. DERMATOLOGIC: No rash, no itching, no lesions. ENDOCRINE: No polyuria, polydipsia, no heat or cold intolerance. No recent change in weight. HEMATOLOGICAL: No anemia or easy bruising or bleeding. NEUROLOGIC: No headache, seizures, numbness, tingling or weakness. PSYCHIATRIC: No depression, no loss of interest in normal activity or change in sleep pattern.     Exam:   BP 132/82   Ht 4' 10.75" (1.492 m)   Wt 165 lb (74.8 kg)   LMP 07/18/2007   BMI  33.61 kg/m   Body mass index is 33.61 kg/m.  General appearance : Well developed well nourished female. No acute distress HEENT: Eyes: no retinal hemorrhage or exudates,  Neck supple, trachea midline, no carotid bruits, no thyroidmegaly Lungs: Clear to auscultation, no rhonchi or wheezes, or rib retractions  Heart: Regular rate and rhythm, no murmurs or gallops Breast:Examined in sitting and supine position were symmetrical in appearance, no palpable masses or tenderness,  no skin retraction, no nipple inversion, no nipple discharge, no skin discoloration, no axillary or supraclavicular lymphadenopathy Abdomen: no palpable masses or tenderness, no rebound or guarding Extremities: no edema or skin discoloration or tenderness  Pelvic: Vulva: Normal             Vagina: No gross lesions or discharge  Cervix: No gross lesions or discharge  Uterus  AV, normal size, shape and consistency, non-tender and mobile  Adnexa  Without masses or tenderness  Anus: Normal   Assessment/Plan:  74 y.o. female for annual exam   1. Well female exam with routine gynecological exam Normal gynecologic exam in menopause.  Pap test - February 2019, no indication to repeat this year.  Breast exam normal.  Screening mammogram March 2019, will schedule a  screening mammogram now.  Colonoscopy 2015.  Schedule fasting health labs with family physician.  2. Postmenopausal Well on no hormone replacement therapy.  No postmenopausal bleeding.  3. Osteopenia of multiple sites Osteopenia at multiple sites.  Follow-up here with a bone density now.  Recommend vitamin D supplements, calcium intake of 1200 mg daily and regular weightbearing physical activities. - DG Bone Density; Future  [name] MD, 4:41 PM 05/18/2020

## 2020-05-22 ENCOUNTER — Encounter: Payer: Self-pay | Admitting: Obstetrics & Gynecology

## 2020-05-22 NOTE — Patient Instructions (Signed)
1. Well female exam with routine gynecological exam Normal gynecologic exam in menopause.  Pap test - February 2019, no indication to repeat this year.  Breast exam normal.  Screening mammogram March 2019, will schedule a screening mammogram now.  Colonoscopy 2015.  Schedule fasting health labs with family physician.  2. Postmenopausal Well on no hormone replacement therapy.  No postmenopausal bleeding.  3. Osteopenia of multiple sites Osteopenia at multiple sites.  Follow-up here with a bone density now.  Recommend vitamin D supplements, calcium intake of 1200 mg daily and regular weightbearing physical activities. - DG Bone Density; Future  [name], it was a pleasure seeing you today!

## 2020-06-08 DIAGNOSIS — M542 Cervicalgia: Secondary | ICD-10-CM | POA: Diagnosis not present

## 2020-06-08 DIAGNOSIS — M5412 Radiculopathy, cervical region: Secondary | ICD-10-CM | POA: Diagnosis not present

## 2020-07-01 ENCOUNTER — Other Ambulatory Visit: Payer: Self-pay | Admitting: Obstetrics & Gynecology

## 2020-07-01 DIAGNOSIS — E039 Hypothyroidism, unspecified: Secondary | ICD-10-CM

## 2020-07-04 NOTE — Telephone Encounter (Signed)
Last TSH level was checked in 2017.  Paxil last filled in 9/20 #30 with 5 refills.

## 2020-07-04 NOTE — Telephone Encounter (Signed)
Dr.Lavoie patient called x 2 regarding refills. I sent in synthroid 100 mcg #30 tablets only. Last TSH level checked in 2017, do you want patient to recheck for more recent result?  I also sent in Paxil 30 mg tablet only #30 as well, please tell me how many refills you would like patient to have.   She was currently out of both medications and was worried about not having her medication.

## 2020-07-05 NOTE — Telephone Encounter (Signed)
Left detailed message on cell recheck TSH level. Patient is current on annual exam.

## 2020-07-22 DIAGNOSIS — M5412 Radiculopathy, cervical region: Secondary | ICD-10-CM | POA: Diagnosis not present

## 2020-08-08 DIAGNOSIS — S29001A Unspecified injury of muscle and tendon of front wall of thorax, initial encounter: Secondary | ICD-10-CM | POA: Diagnosis not present

## 2020-08-16 DIAGNOSIS — E785 Hyperlipidemia, unspecified: Secondary | ICD-10-CM | POA: Diagnosis not present

## 2020-08-16 DIAGNOSIS — Z9181 History of falling: Secondary | ICD-10-CM | POA: Diagnosis not present

## 2020-08-16 DIAGNOSIS — Z0000 Encounter for general adult medical examination without abnormal findings: Secondary | ICD-10-CM | POA: Diagnosis not present

## 2020-09-22 DIAGNOSIS — M5412 Radiculopathy, cervical region: Secondary | ICD-10-CM | POA: Diagnosis not present

## 2020-10-07 ENCOUNTER — Other Ambulatory Visit: Payer: Self-pay | Admitting: Obstetrics & Gynecology

## 2020-10-07 NOTE — Telephone Encounter (Signed)
Annual exam was on 05/18/20

## 2020-10-10 ENCOUNTER — Telehealth: Payer: Self-pay | Admitting: *Deleted

## 2020-10-10 NOTE — Telephone Encounter (Signed)
Patient said she had a TSH level drawn from PCP and will fax results to [facility].

## 2020-10-19 ENCOUNTER — Other Ambulatory Visit: Payer: Medicare Other

## 2020-11-03 DIAGNOSIS — Z0000 Encounter for general adult medical examination without abnormal findings: Secondary | ICD-10-CM | POA: Diagnosis not present

## 2020-11-04 DIAGNOSIS — R29898 Other symptoms and signs involving the musculoskeletal system: Secondary | ICD-10-CM | POA: Diagnosis not present

## 2020-11-04 DIAGNOSIS — M5412 Radiculopathy, cervical region: Secondary | ICD-10-CM | POA: Diagnosis not present

## 2020-11-07 ENCOUNTER — Other Ambulatory Visit: Payer: Self-pay | Admitting: Neurosurgery

## 2020-11-07 ENCOUNTER — Telehealth: Payer: Self-pay

## 2020-11-07 DIAGNOSIS — R29898 Other symptoms and signs involving the musculoskeletal system: Secondary | ICD-10-CM

## 2020-11-07 NOTE — Telephone Encounter (Signed)
Spoke with patient the review her medications before getting her scheduled for a cervical myelogram.  She states an understanding she will be here two hours, will need a driver and will need to be on strict bedrest for 24 hours after the myelo; also, that she will need to hold Paxil for 48 hours before, and 24 hour after, the procedure.

## 2020-11-08 ENCOUNTER — Other Ambulatory Visit: Payer: Self-pay | Admitting: Obstetrics & Gynecology

## 2020-11-09 NOTE — Telephone Encounter (Signed)
Patient reports TSH level was checked by PCP Dr.Douglas Schlutz and normal results was told to continue on same dose of synthroid

## 2020-11-11 ENCOUNTER — Telehealth: Payer: Self-pay

## 2020-11-11 MED ORDER — PAROXETINE HCL 30 MG PO TABS
30.0000 mg | ORAL_TABLET | Freq: Every day | ORAL | 0 refills | Status: DC
Start: 2020-11-11 — End: 2021-04-11

## 2020-11-11 NOTE — Telephone Encounter (Signed)
Patient called to ask If her Paxil prescription could be sent in to where she can get #90 each time. She lives 20 miles from that pharmacy and would like to make less trips. She is due for CE in Feb and has just gotten a refill. Will send #90 with no refills.

## 2020-11-15 ENCOUNTER — Ambulatory Visit
Admission: RE | Admit: 2020-11-15 | Discharge: 2020-11-15 | Disposition: A | Discharge status: Home / Self Care | Payer: Medicare Other | Source: Ambulatory Visit | Attending: Neurosurgery | Admitting: Neurosurgery

## 2020-11-15 ENCOUNTER — Other Ambulatory Visit: Payer: Self-pay

## 2020-11-15 VITALS — BP 123/67 | HR 58

## 2020-11-15 DIAGNOSIS — M5412 Radiculopathy, cervical region: Secondary | ICD-10-CM

## 2020-11-15 DIAGNOSIS — R29898 Other symptoms and signs involving the musculoskeletal system: Secondary | ICD-10-CM

## 2020-11-15 DIAGNOSIS — M4322 Fusion of spine, cervical region: Secondary | ICD-10-CM | POA: Diagnosis not present

## 2020-11-15 MED ORDER — DIAZEPAM 5 MG PO TABS: 5.0000 mg | ORAL_TABLET | Freq: Once | ORAL | Status: DC

## 2020-11-15 MED ORDER — IOPAMIDOL (ISOVUE-M 200) INJECTION 41%
18.0000 mL | Freq: Once | INTRAMUSCULAR | Status: AC
  Administered 2020-11-15: 18 mL via INTRATHECAL

## 2020-11-15 NOTE — Discharge Instructions (Signed)
Myelogram Discharge Instructions  1. Go home and rest quietly for the next 24 hours.  It is important to lie flat for the next 24 hours.  Get up only to go to the restroom.  You may lie in the bed or on a couch on your back, your stomach, your left side or your right side.  You may have one pillow under your head.  You may have pillows between your knees while you are on your side or under your knees while you are on your back.  2. DO NOT drive today.  Recline the seat as far back as it will go, while still wearing your seat belt, on the way home.  3. You may get up to go to the bathroom as needed.  You may sit up for 10 minutes to eat.  You may resume your normal diet and medications unless otherwise indicated.  Drink lots of extra fluids today and tomorrow.  4. The incidence of headache, nausea, or vomiting is about 5% (one in 20 patients).  If you develop a headache, lie flat and drink plenty of fluids until the headache goes away.  Caffeinated beverages may be helpful.  If you develop severe nausea and vomiting or a headache that does not go away with flat bed rest, call [phone].  5. You may resume normal activities after your 24 hours of bed rest is over; however, do not exert yourself strongly or do any heavy lifting tomorrow. If when you get up you have a headache when standing, go back to bed and force fluids for another 24 hours.  6. Call your physician for a follow-up appointment.  The results of your myelogram will be sent directly to your physician by the following day.  7. If you have any questions or if complications develop after you arrive home, please call [phone].  Discharge instructions have been explained to the patient.  The patient, or the person responsible for the patient, fully understands these instructions  YOU MAY RESUME YOUR PAROXETINE TOMORROW 11/16/20 AT 1 PM

## 2020-11-15 NOTE — Progress Notes (Signed)
Pt reports she has been off of her Paxil for at least 48 hours.

## 2020-12-02 DIAGNOSIS — S129XXD Fracture of neck, unspecified, subsequent encounter: Secondary | ICD-10-CM | POA: Diagnosis not present

## 2020-12-02 DIAGNOSIS — R030 Elevated blood-pressure reading, without diagnosis of hypertension: Secondary | ICD-10-CM | POA: Diagnosis not present

## 2020-12-02 DIAGNOSIS — R29898 Other symptoms and signs involving the musculoskeletal system: Secondary | ICD-10-CM | POA: Diagnosis not present

## 2021-01-26 IMAGING — XA DG MYELOGRAPHY LUMBAR INJ CERVICAL
13 of 22 series · 13 of 22 positions shown · non-contrast
Comparison: Cervical spine x-rays dated November 04, 2020. MRI
cervical spine dated June 08, 2020.

CLINICAL DATA: Left-sided neck pain. Numbness and tingling of the
right forearm and hand. Prior C7-T1 fusion in [place] without
improvement in symptoms.
TECHNIQUE: Contiguous axial images were obtained through the cervical spine
after the intrathecal infusion of contrast. Coronal and sagittal
reconstructions were obtained of the axial image sets.

[Series 1: w cervical spine lat · 0.15mm/px · 1 of 1 slices shown]
[im 1/1]
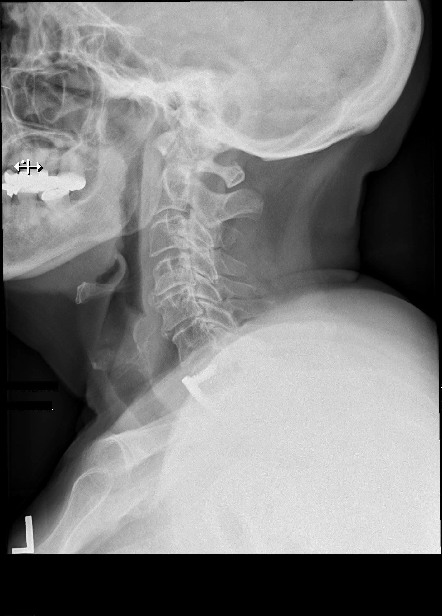

[Series 2: w cervical spine flexion · 0.15mm/px · 1 of 1 slices shown]
[im 1/1]
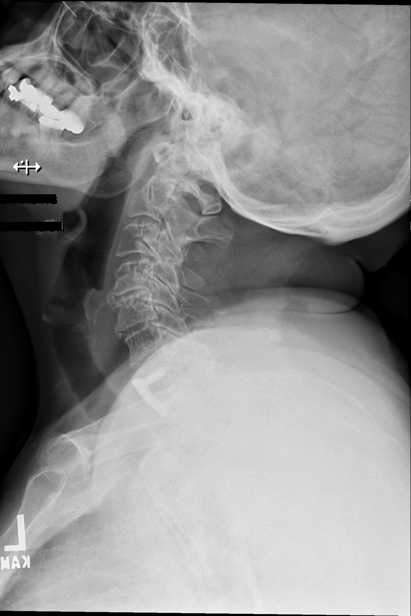

[Series 3: vasc adipose · 1 of 1 slices shown (1 of 10)]
[im 1/1]
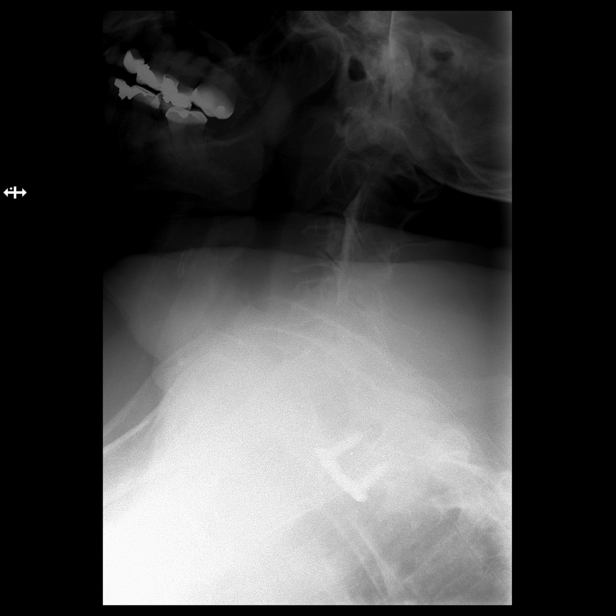

[Series 3: w cervical spine extension · 0.15mm/px · 1 of 1 slices shown]
[im 1/1]
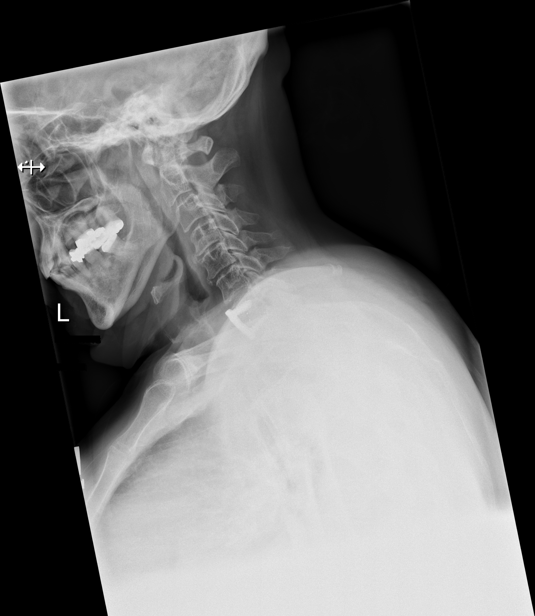

[Series 5: vasc adipose · 1 of 1 slices shown (2 of 10)]
[im 1/1]
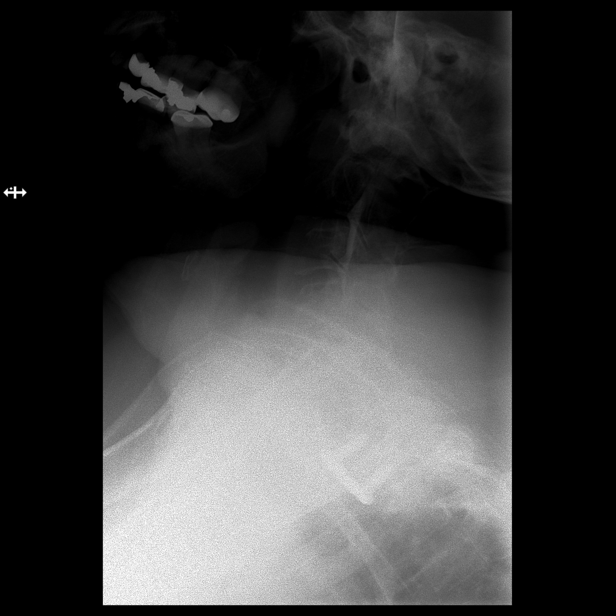

[Series 7: vasc adipose · 1 of 1 slices shown (3 of 10)]
[im 1/1]
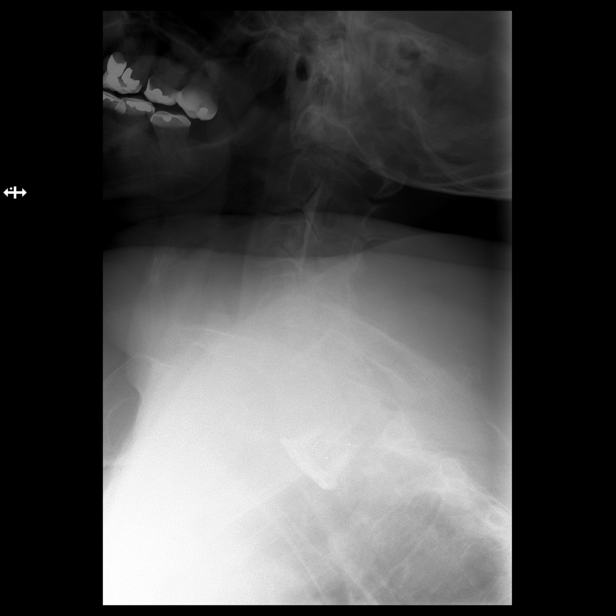

[Series 9: vasc adipose · 1 of 1 slices shown (4 of 10)]
[im 1/1]
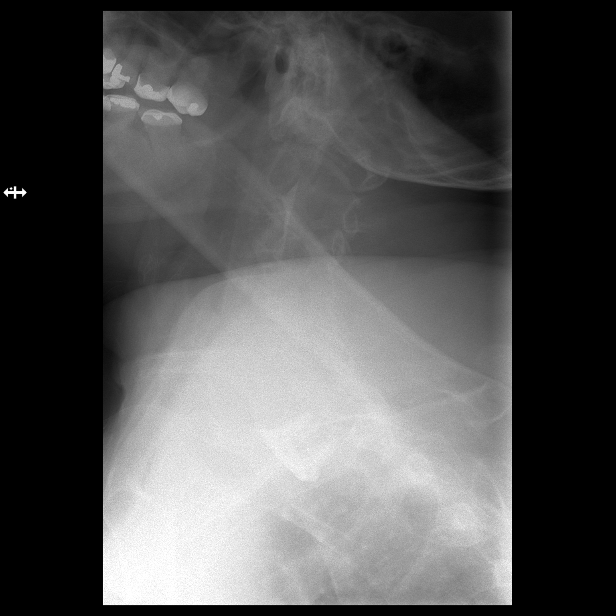

[Series 10: vasc adipose · 1 of 1 slices shown (5 of 10)]
[im 1/1]
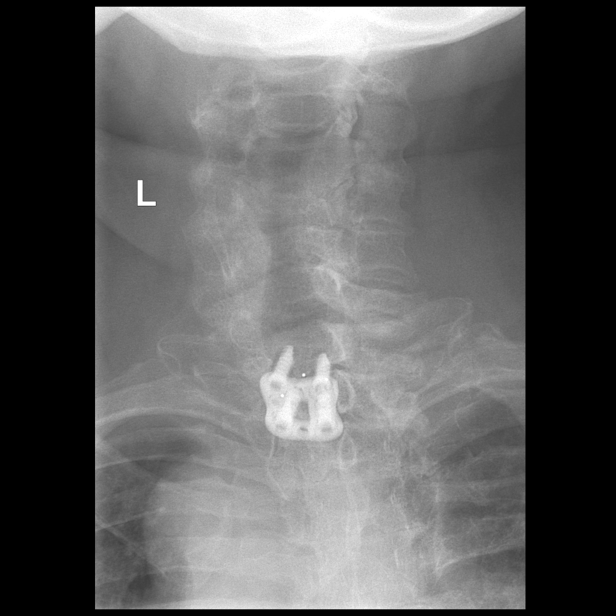

[Series 12: vasc adipose · 1 of 1 slices shown (6 of 10)]
[im 1/1]
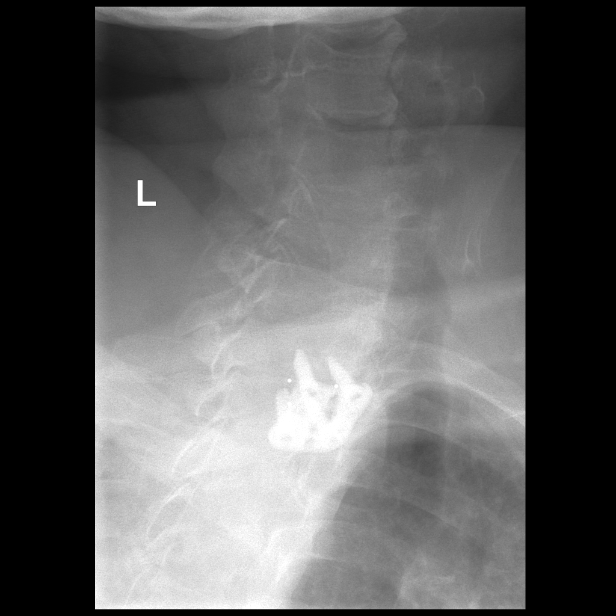

[Series 14: vasc adipose · 1 of 1 slices shown (7 of 10)]
[im 1/1]
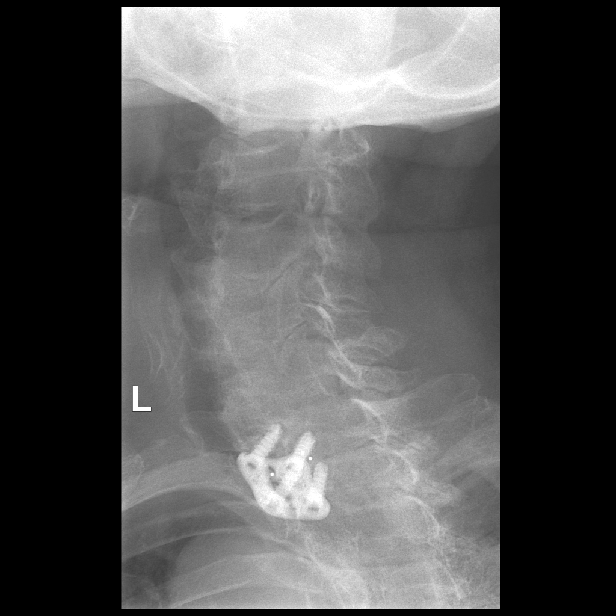

[Series 15: vasc adipose · 1 of 1 slices shown (8 of 10)]
[im 1/1]
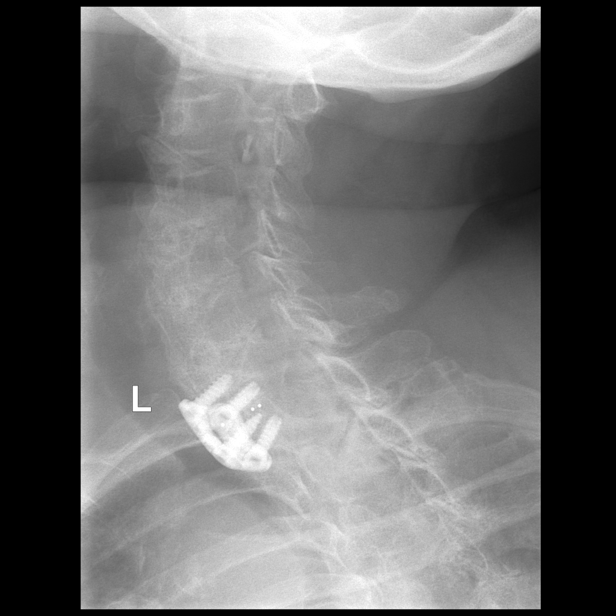

[Series 17: vasc adipose · 1 of 1 slices shown (9 of 10)]
[im 1/1]
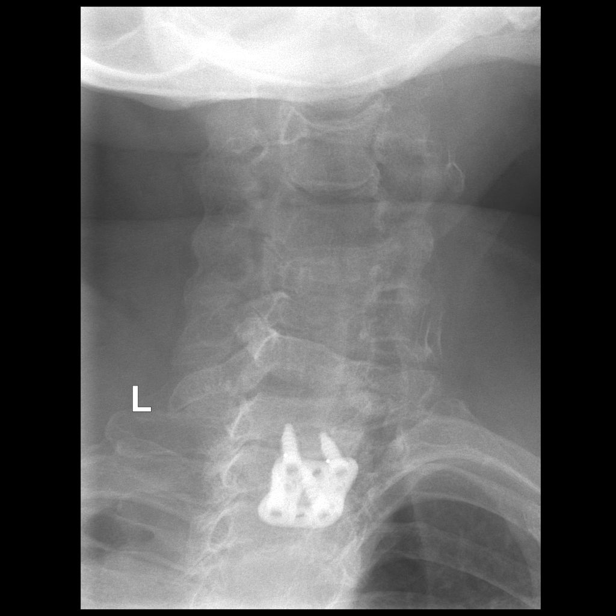

[Series 19: vasc adipose · 1 of 1 slices shown (10 of 10)]
[im 1/1]
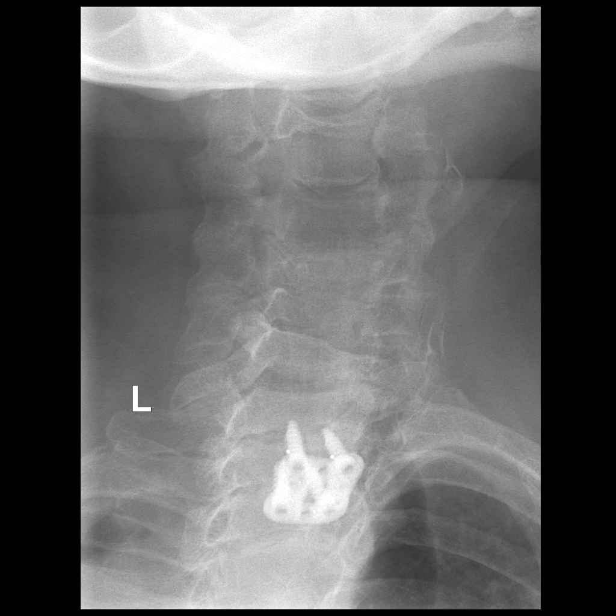

[13 of 22 positions shown; findings below may reference images not displayed]

EXAM:
CERVICAL MYELOGRAM

CT CERVICAL MYELOGRAM

FLUOROSCOPY TIME:  Radiation Exposure Index (as provided by the
fluoroscopic device): 24.3 mGy

Fluoroscopy Time:  2 minutes, 18 seconds

Number of Acquired Images:  21

PROCEDURE:
LUMBAR PUNCTURE FOR CERVICAL MYELOGRAM

After thorough discussion of risks and benefits of the procedure
including bleeding, infection, injury to nerves, blood vessels,
adjacent structures as well as headache and CSF leak, written and
oral informed consent was obtained. Consent was obtained by Dr.
Nelson-Mila Nosbusch. We discussed the high likelihood of obtaining a
diagnostic study.

Patient was positioned prone on the fluoroscopy table. Local
anesthesia was provided with 1% lidocaine without epinephrine after
prepped and draped in the usual sterile fashion. Puncture was
performed at L3-L4 using a 3 1/2 inch 22-gauge spinal needle via
left interlaminar approach. Using a single pass through the dura,
the needle was placed within the thecal sac, with return of clear
CSF. 10 mL of Isovue I-DSS was injected into the thecal sac, with
normal opacification of the nerve roots and cauda equina consistent
with free flow within the subarachnoid space. The patient was then
moved to the trendelenburg position and contrast flowed into the
cervical spine region.

I personally performed the lumbar puncture and administered the
intrathecal contrast. I also personally supervised acquisition of
the myelogram images.
FINDINGS: CERVICAL MYELOGRAM FINDINGS:

Unchanged trace retrolisthesis at C4-C5. No dynamic instability.
Prior C7-T1 ACDF. Small ventral extradural defects at C2-C3, C4-C5,
and C5-C6. No evident spinal canal stenosis or nerve root
effacement.

CT CERVICAL MYELOGRAM FINDINGS:

Alignment: Unchanged trace retrolisthesis at C4-C5.

Skull base and vertebrae: Prior C7-T1 ACDF with prominent subsidence
of the interbody graft into the C7 inferior and T1 superior
endplates. Lucency surrounding the bilateral C7 and T1 screws. No
acute fracture or other focal pathologic process.

Cord: Normal in bulk and morphology.

Soft tissues and upper chest: Negative.

Disc levels:

C2-C3: Unchanged small central disc protrusion and minimal bilateral
uncovertebral hypertrophy. No stenosis.

C3-C4: No significant disc bulge or herniation. Mild bilateral facet
uncovertebral hypertrophy. No stenosis.

C4-C5: Unchanged small posterior disc osteophyte complex with
moderate right and mild left uncovertebral hypertrophy. Unchanged
moderate right neuroforaminal stenosis. No spinal canal or left
neuroforaminal stenosis.

C5-C6: Unchanged small posterior disc osteophyte complex and mild
bilateral uncovertebral hypertrophy. Unchanged mild right
neuroforaminal stenosis. No spinal canal or left neuroforaminal
stenosis.

C6-C7: Unchanged small posterior disc osteophyte complex and mild
right uncovertebral hypertrophy. Unchanged mild left neuroforaminal
stenosis. No spinal canal or right neuroforaminal stenosis.

C7-T1: Prior ACDF. Unchanged moderate right and mild left facet
arthropathy. Unchanged residual mild right neuroforaminal stenosis
due to facet spurring. No spinal canal or left neuroforaminal
stenosis.
IMPRESSION: 1. Prior C7-T1 ACDF with prominent subsidence of the interbody graft
and loosening of the bilateral C7 and T1 screws.
2. Unchanged residual mild right neuroforaminal stenosis at C7-T1
due to facet spurring.
3. Unchanged moderate right neuroforaminal stenosis at C4-C5.

## 2021-02-16 ENCOUNTER — Other Ambulatory Visit: Payer: Self-pay

## 2021-02-16 NOTE — Telephone Encounter (Signed)
Patient wants to be called when this Rx is refilled.

## 2021-02-17 NOTE — Telephone Encounter (Signed)
Patient needs a Fam MD.  Please bring in for a TSH (last TSH in 2017 per chart).  Can send a prescription for 2 weeks until we have the TSH results.

## 2021-02-20 NOTE — Telephone Encounter (Signed)
Patient informed me she had labs drawn with PCP 10/11/20. They are scanned in [employer] information [employer] (I printed result and put in your box to view.)  She said she is more comfortable with you managing this for her if possible.  She is due to see you for AEX in May. Ok to refill Levothyroxine until then?

## 2021-02-21 MED ORDER — LEVOTHYROXINE SODIUM 100 MCG PO TABS
100.0000 ug | ORAL_TABLET | Freq: Every day | ORAL | 3 refills | Status: DC
Start: 2021-02-21 — End: 2021-07-12

## 2021-02-21 NOTE — Telephone Encounter (Signed)
Called patient to tell her refills sent but her voice mail is not set up.

## 2021-02-22 NOTE — Telephone Encounter (Signed)
I called patient again but her mail box is full (that is one the last note should have said instead of "not set up".).

## 2021-04-11 ENCOUNTER — Telehealth: Payer: Self-pay

## 2021-04-11 DIAGNOSIS — J029 Acute pharyngitis, unspecified: Secondary | ICD-10-CM | POA: Diagnosis not present

## 2021-04-11 DIAGNOSIS — J0100 Acute maxillary sinusitis, unspecified: Secondary | ICD-10-CM | POA: Diagnosis not present

## 2021-04-11 DIAGNOSIS — H66009 Acute suppurative otitis media without spontaneous rupture of ear drum, unspecified ear: Secondary | ICD-10-CM | POA: Diagnosis not present

## 2021-04-11 MED ORDER — PAROXETINE HCL 30 MG PO TABS: 30.0000 mg | ORAL_TABLET | Freq: Every day | ORAL | 0 refills | Status: DC

## 2021-04-11 NOTE — Telephone Encounter (Signed)
Patient needs a refill on her Paxil stating she is out of medication.  I spoke with her and informed her AEX is due in May. Staff message sent to [place] to call her to schedule visit and then I will send refill to her pharmacy.

## 2021-04-11 NOTE — Telephone Encounter (Signed)
AEX schedueld 07/12/2021. Refill sent.

## 2021-04-25 DIAGNOSIS — S129XXD Fracture of neck, unspecified, subsequent encounter: Secondary | ICD-10-CM | POA: Diagnosis not present

## 2021-04-25 DIAGNOSIS — R29898 Other symptoms and signs involving the musculoskeletal system: Secondary | ICD-10-CM | POA: Diagnosis not present

## 2021-05-24 ENCOUNTER — Encounter: Payer: Medicare Other | Admitting: Obstetrics & Gynecology

## 2021-05-27 DIAGNOSIS — I6523 Occlusion and stenosis of bilateral carotid arteries: Secondary | ICD-10-CM | POA: Diagnosis not present

## 2021-05-27 DIAGNOSIS — R9389 Abnormal findings on diagnostic imaging of other specified body structures: Secondary | ICD-10-CM | POA: Diagnosis not present

## 2021-05-27 DIAGNOSIS — Z20822 Contact with and (suspected) exposure to covid-19: Secondary | ICD-10-CM | POA: Diagnosis not present

## 2021-05-27 DIAGNOSIS — R519 Headache, unspecified: Secondary | ICD-10-CM | POA: Diagnosis not present

## 2021-05-27 DIAGNOSIS — N390 Urinary tract infection, site not specified: Secondary | ICD-10-CM | POA: Diagnosis not present

## 2021-05-27 DIAGNOSIS — E871 Hypo-osmolality and hyponatremia: Secondary | ICD-10-CM | POA: Diagnosis not present

## 2021-05-27 DIAGNOSIS — R059 Cough, unspecified: Secondary | ICD-10-CM | POA: Diagnosis not present

## 2021-05-27 DIAGNOSIS — K209 Esophagitis, unspecified without bleeding: Secondary | ICD-10-CM | POA: Diagnosis not present

## 2021-05-27 DIAGNOSIS — R5383 Other fatigue: Secondary | ICD-10-CM | POA: Diagnosis not present

## 2021-05-27 DIAGNOSIS — K219 Gastro-esophageal reflux disease without esophagitis: Secondary | ICD-10-CM | POA: Diagnosis not present

## 2021-05-27 DIAGNOSIS — R42 Dizziness and giddiness: Secondary | ICD-10-CM | POA: Diagnosis not present

## 2021-05-27 DIAGNOSIS — K5790 Diverticulosis of intestine, part unspecified, without perforation or abscess without bleeding: Secondary | ICD-10-CM | POA: Diagnosis not present

## 2021-05-27 DIAGNOSIS — I700 Atherosclerosis of aorta: Secondary | ICD-10-CM | POA: Diagnosis not present

## 2021-05-27 DIAGNOSIS — R079 Chest pain, unspecified: Secondary | ICD-10-CM | POA: Diagnosis not present

## 2021-05-27 DIAGNOSIS — K449 Diaphragmatic hernia without obstruction or gangrene: Secondary | ICD-10-CM | POA: Diagnosis not present

## 2021-05-27 DIAGNOSIS — R109 Unspecified abdominal pain: Secondary | ICD-10-CM | POA: Diagnosis not present

## 2021-05-27 DIAGNOSIS — Z792 Long term (current) use of antibiotics: Secondary | ICD-10-CM | POA: Diagnosis not present

## 2021-05-27 DIAGNOSIS — Z79899 Other long term (current) drug therapy: Secondary | ICD-10-CM | POA: Diagnosis not present

## 2021-05-27 DIAGNOSIS — Z888 Allergy status to other drugs, medicaments and biological substances status: Secondary | ICD-10-CM | POA: Diagnosis not present

## 2021-05-27 DIAGNOSIS — K529 Noninfective gastroenteritis and colitis, unspecified: Secondary | ICD-10-CM | POA: Diagnosis not present

## 2021-05-27 DIAGNOSIS — M47813 Spondylosis without myelopathy or radiculopathy, cervicothoracic region: Secondary | ICD-10-CM | POA: Diagnosis not present

## 2021-05-27 DIAGNOSIS — E039 Hypothyroidism, unspecified: Secondary | ICD-10-CM | POA: Diagnosis not present

## 2021-05-27 DIAGNOSIS — F32A Depression, unspecified: Secondary | ICD-10-CM | POA: Diagnosis not present

## 2021-05-27 DIAGNOSIS — R197 Diarrhea, unspecified: Secondary | ICD-10-CM | POA: Diagnosis not present

## 2021-05-28 DIAGNOSIS — K529 Noninfective gastroenteritis and colitis, unspecified: Secondary | ICD-10-CM | POA: Diagnosis not present

## 2021-05-28 DIAGNOSIS — R079 Chest pain, unspecified: Secondary | ICD-10-CM

## 2021-05-28 DIAGNOSIS — N390 Urinary tract infection, site not specified: Secondary | ICD-10-CM | POA: Diagnosis not present

## 2021-05-28 DIAGNOSIS — R9389 Abnormal findings on diagnostic imaging of other specified body structures: Secondary | ICD-10-CM | POA: Diagnosis not present

## 2021-05-29 DIAGNOSIS — R079 Chest pain, unspecified: Secondary | ICD-10-CM | POA: Diagnosis not present

## 2021-05-29 DIAGNOSIS — K529 Noninfective gastroenteritis and colitis, unspecified: Secondary | ICD-10-CM | POA: Diagnosis not present

## 2021-05-29 DIAGNOSIS — R9389 Abnormal findings on diagnostic imaging of other specified body structures: Secondary | ICD-10-CM | POA: Diagnosis not present

## 2021-05-29 DIAGNOSIS — R002 Palpitations: Secondary | ICD-10-CM | POA: Diagnosis not present

## 2021-05-29 DIAGNOSIS — N390 Urinary tract infection, site not specified: Secondary | ICD-10-CM | POA: Diagnosis not present

## 2021-06-02 DIAGNOSIS — K2289 Other specified disease of esophagus: Secondary | ICD-10-CM | POA: Diagnosis not present

## 2021-06-02 DIAGNOSIS — K529 Noninfective gastroenteritis and colitis, unspecified: Secondary | ICD-10-CM | POA: Diagnosis not present

## 2021-06-02 DIAGNOSIS — K3189 Other diseases of stomach and duodenum: Secondary | ICD-10-CM | POA: Diagnosis not present

## 2021-06-02 DIAGNOSIS — R079 Chest pain, unspecified: Secondary | ICD-10-CM | POA: Diagnosis not present

## 2021-06-02 DIAGNOSIS — Z1231 Encounter for screening mammogram for malignant neoplasm of breast: Secondary | ICD-10-CM | POA: Diagnosis not present

## 2021-06-02 DIAGNOSIS — Z139 Encounter for screening, unspecified: Secondary | ICD-10-CM | POA: Diagnosis not present

## 2021-06-02 DIAGNOSIS — N390 Urinary tract infection, site not specified: Secondary | ICD-10-CM | POA: Diagnosis not present

## 2021-06-15 DIAGNOSIS — K219 Gastro-esophageal reflux disease without esophagitis: Secondary | ICD-10-CM | POA: Diagnosis not present

## 2021-07-12 ENCOUNTER — Ambulatory Visit (INDEPENDENT_AMBULATORY_CARE_PROVIDER_SITE_OTHER): Payer: Medicare Other | Admitting: Obstetrics & Gynecology

## 2021-07-12 ENCOUNTER — Other Ambulatory Visit: Payer: Self-pay

## 2021-07-12 ENCOUNTER — Encounter: Payer: Self-pay | Admitting: Obstetrics & Gynecology

## 2021-07-12 VITALS — BP 120/76 | HR 62 | Resp 14 | Ht 58.25 in | Wt 161.0 lb

## 2021-07-12 DIAGNOSIS — E039 Hypothyroidism, unspecified: Secondary | ICD-10-CM

## 2021-07-12 DIAGNOSIS — Z780 Asymptomatic menopausal state: Secondary | ICD-10-CM

## 2021-07-12 DIAGNOSIS — Z01419 Encounter for gynecological examination (general) (routine) without abnormal findings: Secondary | ICD-10-CM

## 2021-07-12 DIAGNOSIS — E559 Vitamin D deficiency, unspecified: Secondary | ICD-10-CM | POA: Diagnosis not present

## 2021-07-12 DIAGNOSIS — F32A Depression, unspecified: Secondary | ICD-10-CM | POA: Diagnosis not present

## 2021-07-12 DIAGNOSIS — M8589 Other specified disorders of bone density and structure, multiple sites: Secondary | ICD-10-CM | POA: Diagnosis not present

## 2021-07-12 MED ORDER — LEVOTHYROXINE SODIUM 100 MCG PO TABS: 100.0000 ug | ORAL_TABLET | Freq: Every day | ORAL | 4 refills | Status: DC

## 2021-07-12 MED ORDER — PAROXETINE HCL 30 MG PO TABS: 30.0000 mg | ORAL_TABLET | Freq: Every day | ORAL | 4 refills | Status: DC

## 2021-07-12 NOTE — Progress Notes (Signed)
[name] [birth year] [id number]   History:    75 y.o. [id number] Married.  Has grand-children.   RP: Established patient presenting for annual gyn exam   HPI: Postmenopausal, well on no HRT.  No PMB.  No pelvic pain.  Abstinent. Urine/BMs normal.  Breasts normal.  BMI 33.36.  Health labs with Fam MD.  [name] 2015.    Past medical history,surgical history, family history and social history were all reviewed and documented in the EPIC chart.  Gynecologic History Patient's last menstrual period was 07/18/2007.  Obstetric History OB History  Gravida Para Term Preterm AB Living  6 3 3   3 3   SAB IAB Ectopic Multiple Live Births  3       3    # Outcome Date GA Lbr Len/2nd Weight Sex Delivery Anes PTL Lv  6 Term 10/10/83    F Vag-Spont  N LIV  5 Term 11/10/75    M Vag-Spont  N LIV  4 Term 04/04/72    F Vag-Spont  N LIV  3 SAB           2 SAB           1 SAB              ROS: A ROS was performed and pertinent positives and negatives are included in the history.  GENERAL: No fevers or chills. HEENT: No change in vision, no earache, sore throat or sinus congestion. NECK: No pain or stiffness. CARDIOVASCULAR: No chest pain or pressure. No palpitations. PULMONARY: No shortness of breath, cough or wheeze. GASTROINTESTINAL: No abdominal pain, nausea, vomiting or diarrhea, melena or bright red blood per rectum. GENITOURINARY: No urinary frequency, urgency, hesitancy or dysuria. MUSCULOSKELETAL: No joint or muscle pain, no back pain, no recent trauma. DERMATOLOGIC: No rash, no itching, no lesions. ENDOCRINE: No polyuria, polydipsia, no heat or cold intolerance. No recent change in weight. HEMATOLOGICAL: No anemia or easy bruising or bleeding. NEUROLOGIC: No headache, seizures, numbness, tingling or weakness. PSYCHIATRIC: No depression, no loss of interest in normal activity or change in sleep pattern.     Exam:   BP 120/76   Pulse 62   Resp 14   Ht 4' 10.25" (1.48 m)   Wt 161 lb (73  kg)   LMP 07/18/2007   BMI 33.36 kg/m   Body mass index is 33.36 kg/m.  General appearance : Well developed well nourished female. No acute distress HEENT: Eyes: no retinal hemorrhage or exudates,  Neck supple, trachea midline, no carotid bruits, no thyroidmegaly Lungs: Clear to auscultation, no rhonchi or wheezes, or rib retractions  Heart: Regular rate and rhythm, no murmurs or gallops Breast:Examined in sitting and supine position were symmetrical in appearance, no palpable masses or tenderness,  no skin retraction, no nipple inversion, no nipple discharge, no skin discoloration, no axillary or supraclavicular lymphadenopathy Abdomen: no palpable masses or tenderness, no rebound or guarding Extremities: no edema or skin discoloration or tenderness  Pelvic: Vulva: Normal             Vagina: No gross lesions or discharge  Cervix: No gross lesions or discharge  Uterus  AV, normal size, shape and consistency, non-tender and mobile  Adnexa  Without masses or tenderness  Anus: Normal   Assessment/Plan:  75 y.o. female for annual exam   1. Well female exam with routine gynecological exam Normal gynecologic exam in menopause.  No indication for a Pap test at this time.  Breast  exam normal.  Schedule a screening mammogram now.  Colonoscopy 2015.  We will follow-up for fasting health labs here. - CBC; Future - Comp Met (CMET); Future - Lipid Profile; Future  2. Postmenopausal Well on no hormone replacement therapy.  No postmenopausal bleeding.  3. Osteopenia of multiple sites Osteopenia on last bone density in 2019.  We will repeat a bone density now.  Vitamin D supplements, calcium intake of 1.5 g/day total and regular weightbearing physical activities recommended. - DG Bone Density; Future  4. Acquired hypothyroidism Patient on levothyroxine 100 mcg daily.  We will check a TSH today. - TSH; Future  5. Depression, unspecified depression type Depression stable on Paxil 30 mg  daily.  Will continue.  6. Vitamin D deficiency - Vitamin D 1,25 dihydroxy; Future  Other orders - PARoxetine (PAXIL) 30 MG tablet; Take 1 tablet (30 mg total) by mouth daily. - levothyroxine (EUTHYROX) 100 MCG tablet; Take 1 tablet (100 mcg total) by mouth daily.   Cheryl Bruins MD, 2:54 PM 07/12/2021

## 2021-07-14 ENCOUNTER — Encounter: Payer: Self-pay | Admitting: Obstetrics & Gynecology

## 2021-07-18 DIAGNOSIS — R4702 Dysphasia: Secondary | ICD-10-CM | POA: Diagnosis not present

## 2021-07-18 DIAGNOSIS — K449 Diaphragmatic hernia without obstruction or gangrene: Secondary | ICD-10-CM | POA: Diagnosis not present

## 2021-07-18 DIAGNOSIS — R1310 Dysphagia, unspecified: Secondary | ICD-10-CM | POA: Diagnosis not present

## 2021-07-18 DIAGNOSIS — D510 Vitamin B12 deficiency anemia due to intrinsic factor deficiency: Secondary | ICD-10-CM | POA: Diagnosis not present

## 2021-07-18 DIAGNOSIS — K2210 Ulcer of esophagus without bleeding: Secondary | ICD-10-CM | POA: Diagnosis not present

## 2021-07-18 DIAGNOSIS — D649 Anemia, unspecified: Secondary | ICD-10-CM | POA: Diagnosis not present

## 2021-07-18 DIAGNOSIS — K2101 Gastro-esophageal reflux disease with esophagitis, with bleeding: Secondary | ICD-10-CM | POA: Diagnosis not present

## 2021-07-18 DIAGNOSIS — K222 Esophageal obstruction: Secondary | ICD-10-CM | POA: Diagnosis not present

## 2021-08-08 ENCOUNTER — Ambulatory Visit (INDEPENDENT_AMBULATORY_CARE_PROVIDER_SITE_OTHER): Payer: Medicare Other

## 2021-08-08 ENCOUNTER — Other Ambulatory Visit: Payer: Self-pay | Admitting: Obstetrics & Gynecology

## 2021-08-08 ENCOUNTER — Other Ambulatory Visit: Payer: Self-pay

## 2021-08-08 ENCOUNTER — Other Ambulatory Visit: Payer: Medicare Other

## 2021-08-08 DIAGNOSIS — Z780 Asymptomatic menopausal state: Secondary | ICD-10-CM | POA: Diagnosis not present

## 2021-08-08 DIAGNOSIS — M8589 Other specified disorders of bone density and structure, multiple sites: Secondary | ICD-10-CM | POA: Diagnosis not present

## 2021-08-21 DIAGNOSIS — K219 Gastro-esophageal reflux disease without esophagitis: Secondary | ICD-10-CM | POA: Diagnosis not present

## 2021-08-21 DIAGNOSIS — R1310 Dysphagia, unspecified: Secondary | ICD-10-CM | POA: Diagnosis not present

## 2021-08-21 DIAGNOSIS — K5790 Diverticulosis of intestine, part unspecified, without perforation or abscess without bleeding: Secondary | ICD-10-CM | POA: Diagnosis not present

## 2021-08-21 DIAGNOSIS — K5900 Constipation, unspecified: Secondary | ICD-10-CM | POA: Diagnosis not present

## 2021-09-06 DIAGNOSIS — E785 Hyperlipidemia, unspecified: Secondary | ICD-10-CM | POA: Diagnosis not present

## 2021-09-06 DIAGNOSIS — Z0000 Encounter for general adult medical examination without abnormal findings: Secondary | ICD-10-CM | POA: Diagnosis not present

## 2021-09-06 DIAGNOSIS — Z9181 History of falling: Secondary | ICD-10-CM | POA: Diagnosis not present

## 2021-10-31 DIAGNOSIS — R29898 Other symptoms and signs involving the musculoskeletal system: Secondary | ICD-10-CM | POA: Diagnosis not present

## 2021-10-31 DIAGNOSIS — Z981 Arthrodesis status: Secondary | ICD-10-CM | POA: Diagnosis not present

## 2021-10-31 DIAGNOSIS — R030 Elevated blood-pressure reading, without diagnosis of hypertension: Secondary | ICD-10-CM | POA: Diagnosis not present

## 2021-12-06 DIAGNOSIS — K219 Gastro-esophageal reflux disease without esophagitis: Secondary | ICD-10-CM | POA: Diagnosis not present

## 2021-12-06 DIAGNOSIS — K222 Esophageal obstruction: Secondary | ICD-10-CM | POA: Diagnosis not present

## 2021-12-06 DIAGNOSIS — Z1231 Encounter for screening mammogram for malignant neoplasm of breast: Secondary | ICD-10-CM | POA: Diagnosis not present

## 2021-12-06 DIAGNOSIS — E039 Hypothyroidism, unspecified: Secondary | ICD-10-CM | POA: Diagnosis not present

## 2021-12-06 DIAGNOSIS — Z23 Encounter for immunization: Secondary | ICD-10-CM | POA: Diagnosis not present

## 2021-12-06 DIAGNOSIS — E559 Vitamin D deficiency, unspecified: Secondary | ICD-10-CM | POA: Diagnosis not present

## 2021-12-06 DIAGNOSIS — M8589 Other specified disorders of bone density and structure, multiple sites: Secondary | ICD-10-CM | POA: Diagnosis not present

## 2021-12-06 DIAGNOSIS — E785 Hyperlipidemia, unspecified: Secondary | ICD-10-CM | POA: Diagnosis not present

## 2021-12-06 DIAGNOSIS — D509 Iron deficiency anemia, unspecified: Secondary | ICD-10-CM | POA: Diagnosis not present

## 2022-02-08 DIAGNOSIS — J029 Acute pharyngitis, unspecified: Secondary | ICD-10-CM | POA: Diagnosis not present

## 2022-02-08 DIAGNOSIS — R0981 Nasal congestion: Secondary | ICD-10-CM | POA: Diagnosis not present

## 2022-02-08 DIAGNOSIS — J0100 Acute maxillary sinusitis, unspecified: Secondary | ICD-10-CM | POA: Diagnosis not present

## 2022-06-07 DIAGNOSIS — S129XXD Fracture of neck, unspecified, subsequent encounter: Secondary | ICD-10-CM | POA: Diagnosis not present

## 2022-06-07 DIAGNOSIS — R29898 Other symptoms and signs involving the musculoskeletal system: Secondary | ICD-10-CM | POA: Diagnosis not present

## 2022-06-20 DIAGNOSIS — E559 Vitamin D deficiency, unspecified: Secondary | ICD-10-CM | POA: Diagnosis not present

## 2022-06-20 DIAGNOSIS — E039 Hypothyroidism, unspecified: Secondary | ICD-10-CM | POA: Diagnosis not present

## 2022-06-20 DIAGNOSIS — E785 Hyperlipidemia, unspecified: Secondary | ICD-10-CM | POA: Diagnosis not present

## 2022-06-20 DIAGNOSIS — K222 Esophageal obstruction: Secondary | ICD-10-CM | POA: Diagnosis not present

## 2022-06-20 DIAGNOSIS — D509 Iron deficiency anemia, unspecified: Secondary | ICD-10-CM | POA: Diagnosis not present

## 2022-06-20 DIAGNOSIS — Z1231 Encounter for screening mammogram for malignant neoplasm of breast: Secondary | ICD-10-CM | POA: Diagnosis not present

## 2022-06-20 DIAGNOSIS — K219 Gastro-esophageal reflux disease without esophagitis: Secondary | ICD-10-CM | POA: Diagnosis not present

## 2022-07-16 ENCOUNTER — Encounter: Payer: Self-pay | Admitting: Obstetrics & Gynecology

## 2022-07-16 ENCOUNTER — Ambulatory Visit (INDEPENDENT_AMBULATORY_CARE_PROVIDER_SITE_OTHER): Payer: Medicare Other | Admitting: Obstetrics & Gynecology

## 2022-07-16 VITALS — BP 128/78 | HR 76 | Resp 12 | Ht 58.5 in | Wt 166.0 lb

## 2022-07-16 DIAGNOSIS — Z780 Asymptomatic menopausal state: Secondary | ICD-10-CM | POA: Diagnosis not present

## 2022-07-16 DIAGNOSIS — E039 Hypothyroidism, unspecified: Secondary | ICD-10-CM | POA: Diagnosis not present

## 2022-07-16 DIAGNOSIS — F32A Depression, unspecified: Secondary | ICD-10-CM

## 2022-07-16 DIAGNOSIS — M8589 Other specified disorders of bone density and structure, multiple sites: Secondary | ICD-10-CM

## 2022-07-16 DIAGNOSIS — Z01419 Encounter for gynecological examination (general) (routine) without abnormal findings: Secondary | ICD-10-CM | POA: Diagnosis not present

## 2022-07-16 MED ORDER — PAROXETINE HCL 30 MG PO TABS: 30.0000 mg | ORAL_TABLET | Freq: Every day | ORAL | 4 refills | Status: DC

## 2022-07-16 MED ORDER — LEVOTHYROXINE SODIUM 100 MCG PO TABS: 100.0000 ug | ORAL_TABLET | Freq: Every day | ORAL | 4 refills | Status: DC

## 2022-07-16 NOTE — Progress Notes (Signed)
[name] 09-26-1946 [id number]   History:    76 y.o. [id number] Married.  Has grand-children.   RP: Established patient presenting for annual gyn exam   HPI: Postmenopausal, well on no HRT.  No PMB.  No pelvic pain.  Abstinent. Pap Neg 01/2018.  No h/o abnormal Pap.  No indication to repeat a Pap at this time.  Urine/BMs normal.  Breasts normal.  Mammo Neg in 2019.  Recommend scheduling mammo now.  Dexa 07/2021 mild Osteopenia T-Score -1.3 at AP Spine and Bilateral Total Hips.  BMI 34.1.  Health labs with Fam MD.  Alen Bleacher 01/2014.    Past medical history,surgical history, family history and social history were all reviewed and documented in the EPIC chart.  Gynecologic History Patient's last menstrual period was 07/18/2007.  Obstetric History OB History  Gravida Para Term Preterm AB Living  6 3 3   3 3   SAB IAB Ectopic Multiple Live Births  3       3    # Outcome Date GA Lbr Len/2nd Weight Sex Delivery Anes PTL Lv  6 Term 10/10/83    F Vag-Spont  N LIV  5 Term 11/10/75    M Vag-Spont  N LIV  4 Term 04/04/72    F Vag-Spont  N LIV  3 SAB           2 SAB           1 SAB              ROS: A ROS was performed and pertinent positives and negatives are included in the history.  GENERAL: No fevers or chills. HEENT: No change in vision, no earache, sore throat or sinus congestion. NECK: No pain or stiffness. CARDIOVASCULAR: No chest pain or pressure. No palpitations. PULMONARY: No shortness of breath, cough or wheeze. GASTROINTESTINAL: No abdominal pain, nausea, vomiting or diarrhea, melena or bright red blood per rectum. GENITOURINARY: No urinary frequency, urgency, hesitancy or dysuria. MUSCULOSKELETAL: No joint or muscle pain, no back pain, no recent trauma. DERMATOLOGIC: No rash, no itching, no lesions. ENDOCRINE: No polyuria, polydipsia, no heat or cold intolerance. No recent change in weight. HEMATOLOGICAL: No anemia or easy bruising or bleeding. NEUROLOGIC: No headache, seizures,  numbness, tingling or weakness. PSYCHIATRIC: No depression, no loss of interest in normal activity or change in sleep pattern.     Exam:   BP 128/78 (BP Location: Left Arm, Patient Position: Sitting, Cuff Size: Normal)   Pulse 76   Resp 12   Ht 4' 10.5" (1.486 m)   Wt 166 lb (75.3 kg)   LMP 07/18/2007   BMI 34.10 kg/m   Body mass index is 34.1 kg/m.  General appearance : Well developed well nourished female. No acute distress HEENT: Eyes: no retinal hemorrhage or exudates,  Neck supple, trachea midline, no carotid bruits, no thyroidmegaly Lungs: Clear to auscultation, no rhonchi or wheezes, or rib retractions  Heart: Regular rate and rhythm, no murmurs or gallops Breast:Examined in sitting and supine position were symmetrical in appearance, no palpable masses or tenderness,  no skin retraction, no nipple inversion, no nipple discharge, no skin discoloration, no axillary or supraclavicular lymphadenopathy Abdomen: no palpable masses or tenderness, no rebound or guarding Extremities: no edema or skin discoloration or tenderness  Pelvic: Vulva: Normal             Vagina: No gross lesions or discharge  Cervix: No gross lesions or discharge  Uterus  AV, normal size,  shape and consistency, non-tender and mobile  Adnexa  Without masses or tenderness  Anus: Normal   Assessment/Plan:  76 y.o. female for annual exam   1. Well female exam with routine gynecological exam Postmenopausal, well on no HRT.  No PMB.  No pelvic pain.  Abstinent. Pap Neg 01/2018.  No h/o abnormal Pap.  No indication to repeat a Pap at this time.  Urine/BMs normal.  Breasts normal.  Mammo Neg in 2019.  Recommend scheduling mammo now.  Dexa 07/2021 mild Osteopenia T-Score -1.3 at AP Spine and Bilateral Total Hips.  BMI 34.1.  Health labs with Fam MD.  Alen Bleacher 01/2014.   2. Postmenopausal Postmenopausal, well on no HRT.  No PMB.  No pelvic pain.  Abstinent.  3. Osteopenia of multiple sites Dexa 07/2021 mild  Osteopenia T-Score -1.3 at AP Spine and Bilateral Total Hips.   4. Acquired hypothyroidism TSH normal 06/20/22.  Stable on Levothyroxine 100 microgram PO daily.  Prescription sent to pharmacy.  5. Depression, unspecified depression type [place] on Paxil.  Continue the same.  Prescription sent to pharmacy.  Other orders - PARoxetine (PAXIL) 30 MG tablet; Take 1 tablet (30 mg total) by mouth daily. - levothyroxine (EUTHYROX) 100 MCG tablet; Take 1 tablet (100 mcg total) by mouth daily.   Genia Del MD, 11:37 AM 07/16/2022

## 2022-08-20 DIAGNOSIS — R1310 Dysphagia, unspecified: Secondary | ICD-10-CM | POA: Diagnosis not present

## 2022-08-20 DIAGNOSIS — R198 Other specified symptoms and signs involving the digestive system and abdomen: Secondary | ICD-10-CM | POA: Diagnosis not present

## 2022-08-20 DIAGNOSIS — K5790 Diverticulosis of intestine, part unspecified, without perforation or abscess without bleeding: Secondary | ICD-10-CM | POA: Diagnosis not present

## 2022-08-20 DIAGNOSIS — K219 Gastro-esophageal reflux disease without esophagitis: Secondary | ICD-10-CM | POA: Diagnosis not present

## 2022-08-30 DIAGNOSIS — R1310 Dysphagia, unspecified: Secondary | ICD-10-CM | POA: Diagnosis not present

## 2022-08-30 DIAGNOSIS — K222 Esophageal obstruction: Secondary | ICD-10-CM | POA: Diagnosis not present

## 2022-08-30 DIAGNOSIS — K2101 Gastro-esophageal reflux disease with esophagitis, with bleeding: Secondary | ICD-10-CM | POA: Diagnosis not present

## 2022-08-30 DIAGNOSIS — K449 Diaphragmatic hernia without obstruction or gangrene: Secondary | ICD-10-CM | POA: Diagnosis not present

## 2022-09-12 DIAGNOSIS — R109 Unspecified abdominal pain: Secondary | ICD-10-CM | POA: Diagnosis not present

## 2022-09-12 DIAGNOSIS — K219 Gastro-esophageal reflux disease without esophagitis: Secondary | ICD-10-CM | POA: Diagnosis not present

## 2022-09-12 DIAGNOSIS — R195 Other fecal abnormalities: Secondary | ICD-10-CM | POA: Diagnosis not present

## 2022-11-06 DIAGNOSIS — H524 Presbyopia: Secondary | ICD-10-CM | POA: Diagnosis not present

## 2022-12-20 DIAGNOSIS — J324 Chronic pansinusitis: Secondary | ICD-10-CM | POA: Diagnosis not present

## 2022-12-20 DIAGNOSIS — R519 Headache, unspecified: Secondary | ICD-10-CM | POA: Diagnosis not present

## 2023-01-06 DIAGNOSIS — S62306A Unspecified fracture of fifth metacarpal bone, right hand, initial encounter for closed fracture: Secondary | ICD-10-CM | POA: Diagnosis not present

## 2023-01-06 DIAGNOSIS — N3001 Acute cystitis with hematuria: Secondary | ICD-10-CM | POA: Diagnosis not present

## 2023-01-06 DIAGNOSIS — N3090 Cystitis, unspecified without hematuria: Secondary | ICD-10-CM | POA: Diagnosis not present

## 2023-01-23 DIAGNOSIS — M79644 Pain in right finger(s): Secondary | ICD-10-CM | POA: Diagnosis not present

## 2023-01-23 DIAGNOSIS — M20021 Boutonniere deformity of right finger(s): Secondary | ICD-10-CM | POA: Diagnosis not present

## 2023-01-23 DIAGNOSIS — M25641 Stiffness of right hand, not elsewhere classified: Secondary | ICD-10-CM | POA: Diagnosis not present

## 2023-01-30 DIAGNOSIS — F8081 Childhood onset fluency disorder: Secondary | ICD-10-CM | POA: Diagnosis not present

## 2023-01-30 DIAGNOSIS — M79644 Pain in right finger(s): Secondary | ICD-10-CM | POA: Diagnosis not present

## 2023-01-30 DIAGNOSIS — M25641 Stiffness of right hand, not elsewhere classified: Secondary | ICD-10-CM | POA: Diagnosis not present

## 2023-01-30 DIAGNOSIS — M20021 Boutonniere deformity of right finger(s): Secondary | ICD-10-CM | POA: Diagnosis not present

## 2023-01-30 DIAGNOSIS — R600 Localized edema: Secondary | ICD-10-CM | POA: Diagnosis not present

## 2023-01-30 DIAGNOSIS — R42 Dizziness and giddiness: Secondary | ICD-10-CM | POA: Diagnosis not present

## 2023-01-31 DIAGNOSIS — R600 Localized edema: Secondary | ICD-10-CM | POA: Diagnosis not present

## 2023-02-01 DIAGNOSIS — K222 Esophageal obstruction: Secondary | ICD-10-CM | POA: Diagnosis not present

## 2023-02-01 DIAGNOSIS — Z1231 Encounter for screening mammogram for malignant neoplasm of breast: Secondary | ICD-10-CM | POA: Diagnosis not present

## 2023-02-01 DIAGNOSIS — D509 Iron deficiency anemia, unspecified: Secondary | ICD-10-CM | POA: Diagnosis not present

## 2023-02-01 DIAGNOSIS — R0609 Other forms of dyspnea: Secondary | ICD-10-CM | POA: Diagnosis not present

## 2023-02-01 DIAGNOSIS — E559 Vitamin D deficiency, unspecified: Secondary | ICD-10-CM | POA: Diagnosis not present

## 2023-02-01 DIAGNOSIS — K219 Gastro-esophageal reflux disease without esophagitis: Secondary | ICD-10-CM | POA: Diagnosis not present

## 2023-02-01 DIAGNOSIS — E039 Hypothyroidism, unspecified: Secondary | ICD-10-CM | POA: Diagnosis not present

## 2023-02-01 DIAGNOSIS — R600 Localized edema: Secondary | ICD-10-CM | POA: Diagnosis not present

## 2023-02-01 DIAGNOSIS — E785 Hyperlipidemia, unspecified: Secondary | ICD-10-CM | POA: Diagnosis not present

## 2023-02-04 DIAGNOSIS — R42 Dizziness and giddiness: Secondary | ICD-10-CM | POA: Diagnosis not present

## 2023-02-04 DIAGNOSIS — R296 Repeated falls: Secondary | ICD-10-CM | POA: Diagnosis not present

## 2023-02-12 DIAGNOSIS — M79644 Pain in right finger(s): Secondary | ICD-10-CM | POA: Diagnosis not present

## 2023-02-12 DIAGNOSIS — M25641 Stiffness of right hand, not elsewhere classified: Secondary | ICD-10-CM | POA: Diagnosis not present

## 2023-02-12 DIAGNOSIS — M20021 Boutonniere deformity of right finger(s): Secondary | ICD-10-CM | POA: Diagnosis not present

## 2023-02-15 DIAGNOSIS — I517 Cardiomegaly: Secondary | ICD-10-CM | POA: Diagnosis not present

## 2023-02-15 DIAGNOSIS — Z1231 Encounter for screening mammogram for malignant neoplasm of breast: Secondary | ICD-10-CM | POA: Diagnosis not present

## 2023-02-15 DIAGNOSIS — I082 Rheumatic disorders of both aortic and tricuspid valves: Secondary | ICD-10-CM | POA: Diagnosis not present

## 2023-02-15 DIAGNOSIS — R600 Localized edema: Secondary | ICD-10-CM | POA: Diagnosis not present

## 2023-02-15 DIAGNOSIS — R42 Dizziness and giddiness: Secondary | ICD-10-CM | POA: Diagnosis not present

## 2023-02-15 DIAGNOSIS — F8081 Childhood onset fluency disorder: Secondary | ICD-10-CM | POA: Diagnosis not present

## 2023-02-15 DIAGNOSIS — R0609 Other forms of dyspnea: Secondary | ICD-10-CM | POA: Diagnosis not present

## 2023-02-19 DIAGNOSIS — R42 Dizziness and giddiness: Secondary | ICD-10-CM | POA: Diagnosis not present

## 2023-02-19 DIAGNOSIS — R296 Repeated falls: Secondary | ICD-10-CM | POA: Diagnosis not present

## 2023-02-19 DIAGNOSIS — R2681 Unsteadiness on feet: Secondary | ICD-10-CM | POA: Diagnosis not present

## 2023-02-26 DIAGNOSIS — R296 Repeated falls: Secondary | ICD-10-CM | POA: Diagnosis not present

## 2023-02-26 DIAGNOSIS — R2681 Unsteadiness on feet: Secondary | ICD-10-CM | POA: Diagnosis not present

## 2023-02-26 DIAGNOSIS — R42 Dizziness and giddiness: Secondary | ICD-10-CM | POA: Diagnosis not present

## 2023-02-27 DIAGNOSIS — M20021 Boutonniere deformity of right finger(s): Secondary | ICD-10-CM | POA: Diagnosis not present

## 2023-02-27 DIAGNOSIS — M25641 Stiffness of right hand, not elsewhere classified: Secondary | ICD-10-CM | POA: Diagnosis not present

## 2023-03-04 DIAGNOSIS — M20021 Boutonniere deformity of right finger(s): Secondary | ICD-10-CM | POA: Diagnosis not present

## 2023-03-11 DIAGNOSIS — M20021 Boutonniere deformity of right finger(s): Secondary | ICD-10-CM | POA: Diagnosis not present

## 2023-03-11 DIAGNOSIS — M25641 Stiffness of right hand, not elsewhere classified: Secondary | ICD-10-CM | POA: Diagnosis not present

## 2023-03-18 DIAGNOSIS — M20021 Boutonniere deformity of right finger(s): Secondary | ICD-10-CM | POA: Diagnosis not present

## 2023-03-18 DIAGNOSIS — M79644 Pain in right finger(s): Secondary | ICD-10-CM | POA: Diagnosis not present

## 2023-03-18 DIAGNOSIS — M25641 Stiffness of right hand, not elsewhere classified: Secondary | ICD-10-CM | POA: Diagnosis not present

## 2023-04-24 DIAGNOSIS — M20021 Boutonniere deformity of right finger(s): Secondary | ICD-10-CM | POA: Diagnosis not present

## 2023-05-22 DIAGNOSIS — M25641 Stiffness of right hand, not elsewhere classified: Secondary | ICD-10-CM | POA: Diagnosis not present

## 2023-05-22 DIAGNOSIS — M20021 Boutonniere deformity of right finger(s): Secondary | ICD-10-CM | POA: Diagnosis not present

## 2023-06-26 DIAGNOSIS — M20021 Boutonniere deformity of right finger(s): Secondary | ICD-10-CM | POA: Diagnosis not present

## 2023-07-17 ENCOUNTER — Ambulatory Visit: Payer: Medicare Other | Admitting: Obstetrics & Gynecology

## 2023-08-05 DIAGNOSIS — S069X0A Unspecified intracranial injury without loss of consciousness, initial encounter: Secondary | ICD-10-CM | POA: Diagnosis not present

## 2023-08-05 DIAGNOSIS — R42 Dizziness and giddiness: Secondary | ICD-10-CM | POA: Diagnosis not present

## 2023-08-05 DIAGNOSIS — S0003XA Contusion of scalp, initial encounter: Secondary | ICD-10-CM | POA: Diagnosis not present

## 2023-08-06 DIAGNOSIS — S069X0A Unspecified intracranial injury without loss of consciousness, initial encounter: Secondary | ICD-10-CM | POA: Diagnosis not present

## 2023-08-06 DIAGNOSIS — I6782 Cerebral ischemia: Secondary | ICD-10-CM | POA: Diagnosis not present

## 2023-08-06 DIAGNOSIS — H538 Other visual disturbances: Secondary | ICD-10-CM | POA: Diagnosis not present

## 2023-08-06 DIAGNOSIS — R9082 White matter disease, unspecified: Secondary | ICD-10-CM | POA: Diagnosis not present

## 2023-08-14 DIAGNOSIS — M5030 Other cervical disc degeneration, unspecified cervical region: Secondary | ICD-10-CM | POA: Diagnosis not present

## 2023-08-14 DIAGNOSIS — S3992XD Unspecified injury of lower back, subsequent encounter: Secondary | ICD-10-CM | POA: Diagnosis not present

## 2023-08-14 DIAGNOSIS — R296 Repeated falls: Secondary | ICD-10-CM | POA: Diagnosis not present

## 2023-08-14 DIAGNOSIS — I700 Atherosclerosis of aorta: Secondary | ICD-10-CM | POA: Diagnosis not present

## 2023-08-14 DIAGNOSIS — R000 Tachycardia, unspecified: Secondary | ICD-10-CM | POA: Diagnosis not present

## 2023-08-14 DIAGNOSIS — Z981 Arthrodesis status: Secondary | ICD-10-CM | POA: Diagnosis not present

## 2023-08-14 DIAGNOSIS — S161XXA Strain of muscle, fascia and tendon at neck level, initial encounter: Secondary | ICD-10-CM | POA: Diagnosis not present

## 2023-08-14 DIAGNOSIS — R300 Dysuria: Secondary | ICD-10-CM | POA: Diagnosis not present

## 2023-08-14 DIAGNOSIS — M47812 Spondylosis without myelopathy or radiculopathy, cervical region: Secondary | ICD-10-CM | POA: Diagnosis not present

## 2023-08-14 DIAGNOSIS — W19XXXA Unspecified fall, initial encounter: Secondary | ICD-10-CM | POA: Diagnosis not present

## 2023-08-14 DIAGNOSIS — M545 Low back pain, unspecified: Secondary | ICD-10-CM | POA: Diagnosis not present

## 2023-08-14 DIAGNOSIS — S32010A Wedge compression fracture of first lumbar vertebra, initial encounter for closed fracture: Secondary | ICD-10-CM | POA: Diagnosis not present

## 2023-08-16 DIAGNOSIS — J019 Acute sinusitis, unspecified: Secondary | ICD-10-CM | POA: Diagnosis not present

## 2023-08-16 DIAGNOSIS — S32000B Wedge compression fracture of unspecified lumbar vertebra, initial encounter for open fracture: Secondary | ICD-10-CM | POA: Diagnosis not present

## 2023-08-16 DIAGNOSIS — U071 COVID-19: Secondary | ICD-10-CM | POA: Diagnosis not present

## 2023-08-16 DIAGNOSIS — B9689 Other specified bacterial agents as the cause of diseases classified elsewhere: Secondary | ICD-10-CM | POA: Diagnosis not present

## 2023-08-18 ENCOUNTER — Other Ambulatory Visit: Payer: Self-pay | Admitting: Obstetrics & Gynecology

## 2023-08-19 NOTE — Telephone Encounter (Signed)
Med refill request: levothyroxine 100 mcg tab PO daily Last AEX: 07/16/22 -ML Next AEX: Not scheduled.  Last MMG (if hormonal med) N/A  Per review of EPIC, last TSH 06/20/22 -[facility]  Call placed to patient, left detailed message, ok per dpr. Advised OV needed for additional refills, return call to office to schedule at [phone], opt 1 or opt 4 for any additional questions.   Rx pended #30/0RF needs OV for refills.  Refill authorized: Please Advise?

## 2023-08-27 ENCOUNTER — Telehealth: Payer: Self-pay

## 2023-08-27 NOTE — Telephone Encounter (Signed)
Transition Care Management Follow-up Telephone Call Date of discharge and from where: 08/15/2023 [facility] How have you been since you were released from the hospital? Patient stated she is feeling a little better. Any questions or concerns? No  Items Reviewed: Did the pt receive and understand the discharge instructions provided? Yes  Medications obtained and verified? Yes  Other? No  Any new allergies since your discharge? No  Dietary orders reviewed? Yes Do you have support at home? Yes   Follow up appointments reviewed:  PCP Hospital f/u appt confirmed? No  Scheduled to see  on  @ . Specialist Hospital f/u appt confirmed? No  Scheduled to see  on  @ . Are transportation arrangements needed? No  If their condition worsens, is the pt aware to call PCP or go to the Emergency Dept.? Yes Was the patient provided with contact information for the PCP's office or ED? Yes Was to pt encouraged to call back with questions or concerns? Yes  Javarious Elsayed [facility]  [facility] Population Health Community Resource Care Guide   [email or web address]  ?? [phone]   Website: [email or web address]  [email or web address]

## 2023-09-05 DIAGNOSIS — S32000A Wedge compression fracture of unspecified lumbar vertebra, initial encounter for closed fracture: Secondary | ICD-10-CM | POA: Diagnosis not present

## 2023-09-05 DIAGNOSIS — M5451 Vertebrogenic low back pain: Secondary | ICD-10-CM | POA: Diagnosis not present

## 2023-09-20 ENCOUNTER — Other Ambulatory Visit: Payer: Self-pay | Admitting: Obstetrics and Gynecology

## 2023-09-23 NOTE — Telephone Encounter (Signed)
Med refill request: Synthroid  Last AEX: 07/16/2022-ML Next AEX: Recall sent per EMR-nothing scheduled currently.  Last MMG (if hormonal med): n/a Refill authorized: rx pend.  Last checked 06/20/2022-2.850  Msg sent to appt desk to make appt.

## 2023-09-23 NOTE — Telephone Encounter (Signed)
Pt now scheduled for 10/03/2023.

## 2023-10-02 DIAGNOSIS — M169 Osteoarthritis of hip, unspecified: Secondary | ICD-10-CM | POA: Diagnosis not present

## 2023-10-02 DIAGNOSIS — M5451 Vertebrogenic low back pain: Secondary | ICD-10-CM | POA: Diagnosis not present

## 2023-10-03 ENCOUNTER — Encounter: Payer: Self-pay | Admitting: Nurse Practitioner

## 2023-10-03 ENCOUNTER — Ambulatory Visit: Payer: Medicare Other | Admitting: Nurse Practitioner

## 2023-10-03 VITALS — BP 136/88 | HR 69

## 2023-10-03 DIAGNOSIS — E039 Hypothyroidism, unspecified: Secondary | ICD-10-CM | POA: Diagnosis not present

## 2023-10-03 DIAGNOSIS — F419 Anxiety disorder, unspecified: Secondary | ICD-10-CM

## 2023-10-03 DIAGNOSIS — M8589 Other specified disorders of bone density and structure, multiple sites: Secondary | ICD-10-CM

## 2023-10-03 MED ORDER — LEVOTHYROXINE SODIUM 100 MCG PO TABS
100.0000 ug | ORAL_TABLET | Freq: Every day | ORAL | 3 refills | Status: DC
Start: 2023-10-03 — End: 2024-10-05

## 2023-10-03 MED ORDER — PAROXETINE HCL 30 MG PO TABS
30.0000 mg | ORAL_TABLET | Freq: Every day | ORAL | 3 refills | Status: DC
Start: 2023-10-03 — End: 2024-10-05

## 2023-10-03 NOTE — Progress Notes (Signed)
   [name] 09/01/1946 [id number]   History:  77 y.o. [id number] presents for medication management. H/O hypothyroidism. Labs done in November with PCP, will have records sent here. Euthyroid for years. Anxiety managed well with Paxil. Mild osteopenia.   Gynecologic History Patient's last menstrual period was 07/18/2007.   Contraception: post menopausal status Sexually active: No  Health Maintenance Last Pap: 01/31/2018. Results were: Normal Last mammogram: 2023. Results were: Normal per patient Last colonoscopy: 01/27/2014 Last Dexa: 08/08/2021. Results were: T-score -1.3, FRAX 7.9% / 0.7%  Past medical history, past surgical history, family history and social history were all reviewed and documented in the EPIC chart. 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  ROS:  A ROS was performed and pertinent positives and negatives are included.  Exam:  Vitals:   10/03/23 1516  BP: 136/88  Pulse: 69  SpO2: 97%   There is no height or weight on file to calculate BMI. Physical Exam Constitutional:      Appearance: Normal appearance.    Assessment/Plan:  Cheryl y.o. [id number] for medication management.   Acquired hypothyroidism - Plan: levothyroxine (SYNTHROID) 100 MCG tablet each morning. Will send labs in November when completed by PCP. Euthyroid for years.   Anxiety - Plan: PARoxetine (PAXIL) 30 MG tablet daily. Doing well on this and wants to continue.   Osteopenia of multiple sites - Plan: DG Bone Density. 07/2021 T-score -1.3 without elevated FRAX. Due for DXA.   Return in about 1 year (around 10/02/2024) for B&P.    Olivia Mackie DNP, 3:47 PM 10/03/2023

## 2023-10-22 DIAGNOSIS — M160 Bilateral primary osteoarthritis of hip: Secondary | ICD-10-CM | POA: Diagnosis not present

## 2023-10-22 DIAGNOSIS — M1611 Unilateral primary osteoarthritis, right hip: Secondary | ICD-10-CM | POA: Diagnosis not present

## 2023-10-30 DIAGNOSIS — S32000A Wedge compression fracture of unspecified lumbar vertebra, initial encounter for closed fracture: Secondary | ICD-10-CM | POA: Diagnosis not present

## 2023-11-29 DIAGNOSIS — R319 Hematuria, unspecified: Secondary | ICD-10-CM | POA: Diagnosis not present

## 2024-05-26 DIAGNOSIS — R195 Other fecal abnormalities: Secondary | ICD-10-CM | POA: Diagnosis not present

## 2024-05-26 DIAGNOSIS — R1310 Dysphagia, unspecified: Secondary | ICD-10-CM | POA: Diagnosis not present

## 2024-05-26 DIAGNOSIS — K219 Gastro-esophageal reflux disease without esophagitis: Secondary | ICD-10-CM | POA: Diagnosis not present

## 2024-07-14 DIAGNOSIS — K222 Esophageal obstruction: Secondary | ICD-10-CM | POA: Diagnosis not present

## 2024-07-14 DIAGNOSIS — K2210 Ulcer of esophagus without bleeding: Secondary | ICD-10-CM | POA: Diagnosis not present

## 2024-07-14 DIAGNOSIS — E039 Hypothyroidism, unspecified: Secondary | ICD-10-CM | POA: Diagnosis not present

## 2024-07-14 DIAGNOSIS — R194 Change in bowel habit: Secondary | ICD-10-CM | POA: Diagnosis not present

## 2024-07-14 DIAGNOSIS — Z860100 Personal history of colon polyps, unspecified: Secondary | ICD-10-CM | POA: Diagnosis not present

## 2024-07-14 DIAGNOSIS — R1310 Dysphagia, unspecified: Secondary | ICD-10-CM | POA: Diagnosis not present

## 2024-07-14 DIAGNOSIS — E7800 Pure hypercholesterolemia, unspecified: Secondary | ICD-10-CM | POA: Diagnosis not present

## 2024-07-14 DIAGNOSIS — Z888 Allergy status to other drugs, medicaments and biological substances status: Secondary | ICD-10-CM | POA: Diagnosis not present

## 2024-07-14 DIAGNOSIS — R195 Other fecal abnormalities: Secondary | ICD-10-CM | POA: Diagnosis not present

## 2024-07-14 DIAGNOSIS — K449 Diaphragmatic hernia without obstruction or gangrene: Secondary | ICD-10-CM | POA: Diagnosis not present

## 2024-07-14 DIAGNOSIS — K219 Gastro-esophageal reflux disease without esophagitis: Secondary | ICD-10-CM | POA: Diagnosis not present

## 2024-07-14 DIAGNOSIS — Z886 Allergy status to analgesic agent status: Secondary | ICD-10-CM | POA: Diagnosis not present

## 2024-07-14 DIAGNOSIS — Z800 Family history of malignant neoplasm of digestive organs: Secondary | ICD-10-CM | POA: Diagnosis not present

## 2024-07-14 DIAGNOSIS — K5730 Diverticulosis of large intestine without perforation or abscess without bleeding: Secondary | ICD-10-CM | POA: Diagnosis not present

## 2024-07-14 DIAGNOSIS — K2211 Ulcer of esophagus with bleeding: Secondary | ICD-10-CM | POA: Diagnosis not present

## 2024-09-21 ENCOUNTER — Telehealth: Payer: Self-pay | Admitting: *Deleted

## 2024-09-21 DIAGNOSIS — M8589 Other specified disorders of bone density and structure, multiple sites: Secondary | ICD-10-CM

## 2024-09-21 NOTE — Telephone Encounter (Signed)
 Patient left message requesting BMD order.  Scheduled for 10/06/24.  Location not specified. Per review of EPIC, MMGs at [facility].

## 2024-09-22 NOTE — Telephone Encounter (Signed)
 Call to patient. Patient states she is not currently scheduled for Bone Density scan. Lives in [place] area so requesting somewhere close. Advised would place order to be done at [facility]. Phone number provided to patient.   Order pended for BMD to [facility].

## 2024-09-22 NOTE — Telephone Encounter (Signed)
 Signed. Thank you.

## 2024-09-23 DIAGNOSIS — E559 Vitamin D deficiency, unspecified: Secondary | ICD-10-CM | POA: Diagnosis not present

## 2024-09-23 DIAGNOSIS — K219 Gastro-esophageal reflux disease without esophagitis: Secondary | ICD-10-CM | POA: Diagnosis not present

## 2024-09-23 DIAGNOSIS — K222 Esophageal obstruction: Secondary | ICD-10-CM | POA: Diagnosis not present

## 2024-09-23 DIAGNOSIS — E785 Hyperlipidemia, unspecified: Secondary | ICD-10-CM | POA: Diagnosis not present

## 2024-09-23 DIAGNOSIS — Z1231 Encounter for screening mammogram for malignant neoplasm of breast: Secondary | ICD-10-CM | POA: Diagnosis not present

## 2024-09-23 DIAGNOSIS — E039 Hypothyroidism, unspecified: Secondary | ICD-10-CM | POA: Diagnosis not present

## 2024-09-23 DIAGNOSIS — Z23 Encounter for immunization: Secondary | ICD-10-CM | POA: Diagnosis not present

## 2024-10-02 ENCOUNTER — Ambulatory Visit (HOSPITAL_BASED_OUTPATIENT_CLINIC_OR_DEPARTMENT_OTHER)
Admission: RE | Admit: 2024-10-02 | Discharge: 2024-10-02 | Disposition: A | Discharge status: Home / Self Care | Source: Ambulatory Visit | Attending: Nurse Practitioner | Admitting: Nurse Practitioner

## 2024-10-02 DIAGNOSIS — M8589 Other specified disorders of bone density and structure, multiple sites: Secondary | ICD-10-CM

## 2024-10-02 DIAGNOSIS — Z780 Asymptomatic menopausal state: Secondary | ICD-10-CM | POA: Diagnosis not present

## 2024-10-05 ENCOUNTER — Encounter: Payer: Self-pay | Admitting: Nurse Practitioner

## 2024-10-05 ENCOUNTER — Ambulatory Visit: Payer: Medicare Other | Admitting: Nurse Practitioner

## 2024-10-05 VITALS — BP 116/78 | HR 58 | Resp 16 | Ht <= 58 in | Wt 159.0 lb

## 2024-10-05 DIAGNOSIS — Z780 Asymptomatic menopausal state: Secondary | ICD-10-CM | POA: Diagnosis not present

## 2024-10-05 DIAGNOSIS — Z01419 Encounter for gynecological examination (general) (routine) without abnormal findings: Secondary | ICD-10-CM

## 2024-10-05 DIAGNOSIS — F419 Anxiety disorder, unspecified: Secondary | ICD-10-CM | POA: Diagnosis not present

## 2024-10-05 DIAGNOSIS — M8589 Other specified disorders of bone density and structure, multiple sites: Secondary | ICD-10-CM | POA: Diagnosis not present

## 2024-10-05 DIAGNOSIS — E039 Hypothyroidism, unspecified: Secondary | ICD-10-CM | POA: Diagnosis not present

## 2024-10-05 MED ORDER — PAROXETINE HCL 30 MG PO TABS
30.0000 mg | ORAL_TABLET | Freq: Every day | ORAL | 3 refills | Status: AC
Start: 2024-10-05 — End: ?

## 2024-10-05 MED ORDER — LEVOTHYROXINE SODIUM 100 MCG PO TABS
100.0000 ug | ORAL_TABLET | Freq: Every day | ORAL | 3 refills | Status: AC
Start: 2024-10-05 — End: ?

## 2024-10-05 NOTE — Progress Notes (Unsigned)
   [name] Mar 20, 1946 [id number]   History:  78 y.o. [id number] presents for breast and pelvic exam. Postmenopausal - no HRT, no bleeding. Normal pap history. Osteopenia. TSH 4.300 on 09/23/2024. Doing well on Paxil .   Gynecologic History Patient's last menstrual period was 07/18/2007.   Contraception: post menopausal status Sexually active: No  Health Maintenance Last Pap: 01/31/2018. Results were: Normal Last mammogram: 2023. Results were: Normal per patient Last colonoscopy: 07/20/2024 Last Dexa: 10/02/2024. Results were: T-score -1.5 (no FRAX d/t h/o vertebral fracture)  Past medical history, past surgical history, family history and social history were all reviewed and documented in the EPIC chart. 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 Husband has stage 4 prostate cancer.   ROS:  A ROS was performed and pertinent positives and negatives are included.  Exam:  Vitals:   10/05/24 1551  BP: 116/78  Pulse: (!) 58  Resp: 16  SpO2: 98%  Weight: 159 lb (72.1 kg)  Height: 4' 9.75 (1.467 m)   Body mass index is 33.52 kg/m. Physical Exam Constitutional:      Appearance: Normal appearance.  Neck:     Thyroid : No thyroid  mass, thyromegaly or thyroid  tenderness.  Cardiovascular:     Rate and Rhythm: Normal rate and regular rhythm.  Pulmonary:     Effort: Pulmonary effort is normal.     Breath sounds: Normal breath sounds.  Chest:  Breasts:    Right: Normal.     Left: Normal.  Abdominal:     Palpations: Abdomen is soft.     Tenderness: There is no abdominal tenderness.  Genitourinary:    General: Normal vulva.     Vagina: Normal.     Cervix: Normal.     Uterus: Normal.      Adnexa: Right adnexa normal and left adnexa normal.     Rectum: Normal.     Comments: Atrophic changes Lymphadenopathy:     Upper Body:     Right upper body: No supraclavicular or axillary adenopathy.     Left upper body: No supraclavicular or axillary adenopathy.    [name], CMA  present as chaperone.   Assessment/Plan:  78 y.o. [id number] for breast and pelvic exam.   Encounter for breast and pelvic examination - Education provided on SBEs, importance of preventative screenings, current guidelines, high calcium diet, regular exercise, and multivitamin daily. Labs with PCP.   Postmenopausal - no HRT, no bleeding.   Osteopenia of multiple sites - 10/02/24 T-score -1.5 without elevated FRAX. Continue Vit D, high calcium diet and exercise regularly.   Anxiety - Plan: PARoxetine  (PAXIL ) 30 MG tablet daily. Doing well and wants to continue.   Acquired hypothyroidism - Plan: levothyroxine  (SYNTHROID ) 100 MCG tablet daily. 09/23/24 TSH 4.300.  Screening for cervical cancer - Normal Pap history.  No longer screening per guidelines.   Screening for breast cancer - Normal mammogram history. Overdue and encouraged to schedule. Normal breast exam today.  Screening for colon cancer - 06/2024 colonoscopy. Screenings no longer recommended.   Return in about 1 year (around 10/05/2025) for Med follow up.    Cheryl DELENA Shutter DNP, 4:32 PM 10/05/2024

## 2024-10-23 DIAGNOSIS — G459 Transient cerebral ischemic attack, unspecified: Secondary | ICD-10-CM | POA: Diagnosis not present

## 2025-02-08 ENCOUNTER — Ambulatory Visit: Admitting: Neurology

## 2025-02-23 ENCOUNTER — Ambulatory Visit: Admitting: Neurology

## 2025-10-06 ENCOUNTER — Ambulatory Visit: Admitting: Nurse Practitioner
